# Patient Record
Sex: Female | Born: 1975 | Race: White | Hispanic: No | Marital: Single | Smoking: Former smoker
Health system: Southern US, Academic
[De-identification: ages and names within clinical notes are randomized; demographics above are authoritative.]

## PROBLEM LIST (undated history)

## (undated) DIAGNOSIS — R42 Dizziness and giddiness: Secondary | ICD-10-CM

## (undated) DIAGNOSIS — E7800 Pure hypercholesterolemia, unspecified: Secondary | ICD-10-CM

## (undated) DIAGNOSIS — K219 Gastro-esophageal reflux disease without esophagitis: Secondary | ICD-10-CM

## (undated) DIAGNOSIS — B1920 Unspecified viral hepatitis C without hepatic coma: Secondary | ICD-10-CM

## (undated) DIAGNOSIS — Z973 Presence of spectacles and contact lenses: Secondary | ICD-10-CM

## (undated) HISTORY — PX: HX HIP REPLACEMENT: SHX124

## (undated) HISTORY — PX: HX TONSILLECTOMY: SHX27

## (undated) HISTORY — PX: HX TUBAL LIGATION: SHX77

## (undated) HISTORY — DX: Dizziness and giddiness: R42

## (undated) HISTORY — PX: HX WISDOM TEETH EXTRACTION: SHX21

## (undated) HISTORY — PX: GASTROSCOPY: WVUENDOPRO49

## (undated) HISTORY — PX: HX HERNIA REPAIR: SHX51

## (undated) HISTORY — PX: MEDIAL COLLATERAL LIGAMENT AND LATERAL COLLATERAL LIGAMENT REPAIR, KNEE: SHX2017

## (undated) HISTORY — PX: HX LIVER BIOPSY: 2100001178

## (undated) HISTORY — PX: SPLENECTOMY, TOTAL: SHX788

---

## 2015-01-02 ENCOUNTER — Other Ambulatory Visit (HOSPITAL_COMMUNITY): Payer: Self-pay | Admitting: Emergency Medicine

## 2016-09-08 ENCOUNTER — Encounter (INDEPENDENT_AMBULATORY_CARE_PROVIDER_SITE_OTHER): Payer: Self-pay | Admitting: Gastroenterology

## 2016-09-08 ENCOUNTER — Ambulatory Visit: Payer: No Typology Code available for payment source | Attending: Gastroenterology | Admitting: Gastroenterology

## 2016-09-08 VITALS — BP 136/87 | HR 67 | Temp 97.9°F | Ht 65.59 in | Wt 185.0 lb

## 2016-09-08 DIAGNOSIS — R1110 Vomiting, unspecified: Secondary | ICD-10-CM | POA: Insufficient documentation

## 2016-09-08 DIAGNOSIS — F17200 Nicotine dependence, unspecified, uncomplicated: Secondary | ICD-10-CM | POA: Insufficient documentation

## 2016-09-08 DIAGNOSIS — G43909 Migraine, unspecified, not intractable, without status migrainosus: Secondary | ICD-10-CM | POA: Insufficient documentation

## 2016-09-08 DIAGNOSIS — R1310 Dysphagia, unspecified: Principal | ICD-10-CM

## 2016-09-08 DIAGNOSIS — Z79899 Other long term (current) drug therapy: Secondary | ICD-10-CM | POA: Insufficient documentation

## 2016-09-08 DIAGNOSIS — Z791 Long term (current) use of non-steroidal anti-inflammatories (NSAID): Secondary | ICD-10-CM | POA: Insufficient documentation

## 2016-09-08 DIAGNOSIS — R112 Nausea with vomiting, unspecified: Secondary | ICD-10-CM
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
## 2016-11-04 ENCOUNTER — Encounter (HOSPITAL_COMMUNITY): Payer: Self-pay

## 2016-11-04 ENCOUNTER — Ambulatory Visit
Admission: RE | Admit: 2016-11-04 | Discharge: 2016-11-04 | Disposition: A | Discharge status: Home / Self Care | Payer: No Typology Code available for payment source | Source: Ambulatory Visit

## 2016-11-05 NOTE — Nurses Notes (Signed)
Patient stated that she was diagnosed with bronchitis 10/27/16. Patient currently being treated with antibiotics. Patient stated she is not having fevers, but still has a non productive cough. NP, Juanetta GoslingJoy Williams notified and stated that patient needed to be rescheduled due to bronchitis. Voice message left at GI scheduler office for patient needing to be rescheduled. GI scheduler number also given to patient to reschedule.

## 2016-11-10 ENCOUNTER — Ambulatory Visit (INDEPENDENT_AMBULATORY_CARE_PROVIDER_SITE_OTHER): Payer: Self-pay | Admitting: Family

## 2016-11-25 ENCOUNTER — Encounter (INDEPENDENT_AMBULATORY_CARE_PROVIDER_SITE_OTHER): Payer: Self-pay | Admitting: Gastroenterology

## 2016-11-25 NOTE — Progress Notes (Signed)
Faxed office notes to Dr Terri PiedraMemines as requested by fax. Annie SableVirginia Yeila Morro, RN  11/25/2016, 14:12

## 2016-12-01 ENCOUNTER — Encounter (INDEPENDENT_AMBULATORY_CARE_PROVIDER_SITE_OTHER): Payer: No Typology Code available for payment source | Admitting: Gastroenterology

## 2017-06-10 ENCOUNTER — Encounter (HOSPITAL_COMMUNITY): Admission: RE | Payer: Self-pay | Source: Ambulatory Visit

## 2017-06-10 ENCOUNTER — Ambulatory Visit (HOSPITAL_COMMUNITY)
Admission: RE | Admit: 2017-06-10 | Payer: No Typology Code available for payment source | Source: Ambulatory Visit | Admitting: Gastroenterology

## 2017-06-10 HISTORY — DX: Gastro-esophageal reflux disease without esophagitis: K21.9

## 2017-06-10 HISTORY — DX: Presence of spectacles and contact lenses: Z97.3

## 2017-06-10 HISTORY — DX: Unspecified viral hepatitis C without hepatic coma: B19.20

## 2017-06-10 SURGERY — MANOMETRY ESOPHAGEAL
Site: Mouth

## 2017-06-11 ENCOUNTER — Encounter (HOSPITAL_COMMUNITY): Admission: RE | Payer: Self-pay | Source: Ambulatory Visit

## 2017-06-11 SURGERY — GASTROSCOPY WITH DILATION
Anesthesia: Monitor Anesthesia Care | Site: Mouth

## 2017-06-14 ENCOUNTER — Ambulatory Visit (HOSPITAL_COMMUNITY)
Admission: RE | Admit: 2017-06-14 | Payer: No Typology Code available for payment source | Source: Ambulatory Visit | Admitting: Gastroenterology

## 2017-07-02 ENCOUNTER — Encounter (INDEPENDENT_AMBULATORY_CARE_PROVIDER_SITE_OTHER): Payer: Self-pay | Admitting: Gastroenterology

## 2017-07-02 NOTE — Progress Notes (Signed)
request for an endoscopic procedure for Melissa Hale has not been scheduled. Our office have made several attempts, both by phone and by mail, to schedule this procedure. However, our best efforts have gone without success, and it is important to us to provide service to you and follow-up with your patient.      Melissa CholSwapna Mamye Bolds, MD  07/02/2017, 09:45

## 2022-03-13 ENCOUNTER — Other Ambulatory Visit (HOSPITAL_COMMUNITY): Payer: Self-pay | Admitting: ORTHOPEDIC, SPORTS MEDICINE

## 2022-03-13 DIAGNOSIS — Z96642 Presence of left artificial hip joint: Secondary | ICD-10-CM

## 2022-03-19 ENCOUNTER — Other Ambulatory Visit: Payer: BC Managed Care – PPO | Attending: ORTHOPEDIC, SPORTS MEDICINE

## 2022-03-19 ENCOUNTER — Other Ambulatory Visit: Payer: Self-pay

## 2022-03-19 DIAGNOSIS — Z01818 Encounter for other preprocedural examination: Secondary | ICD-10-CM | POA: Insufficient documentation

## 2022-03-19 LAB — POTASSIUM: POTASSIUM: 4.3 mmol/L (ref 3.5–5.1)

## 2022-03-27 ENCOUNTER — Ambulatory Visit (HOSPITAL_COMMUNITY): Payer: Self-pay

## 2022-03-31 ENCOUNTER — Other Ambulatory Visit: Payer: Self-pay

## 2022-04-01 ENCOUNTER — Ambulatory Visit (HOSPITAL_COMMUNITY)
Admission: RE | Admit: 2022-04-01 | Discharge: 2022-04-01 | Disposition: A | Discharge status: Still In Hospital | Payer: BC Managed Care – PPO | Source: Ambulatory Visit

## 2022-04-01 ENCOUNTER — Other Ambulatory Visit: Payer: Self-pay

## 2022-04-01 DIAGNOSIS — Z96642 Presence of left artificial hip joint: Secondary | ICD-10-CM | POA: Insufficient documentation

## 2022-04-01 NOTE — PT Evaluation (Signed)
[facility] Medicine [facility]  Outpatient Physical Therapy  [address]  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     Medications for this problem: pain medication, anti-inflammatory and M. 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    Next MD visit: 04/14/22    Pain location: LATERAL HIP ; pt has a wound vac that will be removed upon FU with MD                    Pain description: BURNING and CRAMPING    Pain frequency:  INTERMITTENT    Pain rating: Now 0   Best 0   Worst 10     Radiculopathy: NA    Pain increases with: ACTIVITY, WALK and BENDING           decreases with : MEDICATION and REST    Sensation: NO    Weakness: IN LLE    Sleep affected: GOOD WITH MEDICATION    Subjective Functional Reports:    Sitting: 60    Standing: LIMITED  SEVERAL MINUTES  Walking: 10 MINUTE    Lifting: UNABLE                  OBJECTIVE    PROM:  Left hip: 85 - 90 degrees flexion, 20 degrees ER, 0 degrees IR, 0 degrees abd and extension; AROM is pain inhibited.     ROM comments: limited by pain in all  planes      Strength comments: quad and gluteal inhibition noted. Non functional  At present    Gait: USES ASSISTIVE DEVICE, RECIPROCATING STEPS WITH ASYMMETRIC STRIDE LENGTH, INITIATION OF GAIT WITH HESITATION and using FWW.    Palpation: GLOBAL TENDERNESS    Joint mobility:NA    Posture:GENU VALGUS and FEMORAL LATERAL ROTATION        Treatment provided:REVIEW OF POC AND GOALS WITH PATIENT, ALL QUESTIONS ANSWERED, PATIENT EDUCATION and THERAPEUTIC EXERCISE           ASSESSMENT    Impression: the pt has decrease left LE WB and hip ROM, reliance on AD  On POD #2. She has a good gait pattern with FWW. She is highly motivated to regain function in LLE and RTW.     Rehab potential: GOOD    Short Term Goals: 3 Weeks   -IncreaseHip  PROM by 10 degrees (FLEX, EXT, ABD, IR, ER)   -Strength of L LE WFL for supine SLR without extension extension lag.   -LE strength WFL for static stance with symmetric LE wb without pain.  -LE strength WFL for sit to stand with MIN  use of UE to assist  transition.   -Intermittent vs. constant pain. Worst SPS rating less than [10].   -Wean to less restrictive assistive device with minimal to no gait deviation.   -Patient will be independent in HEP    Long Term Goals: 6 - 8 Weeks  -LE strength and ROM WFL to allow for normal body mechanics with mobility.  -LE strength WFL for symmetric LE WB during sit to stand without UE assist.   -Resume community ambulation without an assistive device.   -Sleep not disrupted by LE pain. Worst SPS rating less than 3   -LE strength / ROM WFL for patient to negotiate steps reciprocally with  rail.   -sustained wb tolerance wfl for rtw      PLAN  Patient will attend 2 times per week x 6 weeks. Therapy may include, but is not limited to THERAPEUTIC EXERCISES, MYOFASCIAL/JOINT MOBILIZATION, POSTURE/BODY MECHANICS, TRANSFER/GAIT TRAINING and HOME INSTRUCTIONS    Plan for next visit nwb ROM and low level strengthening , close chain ROM       Evaluation  complexity:   Personal factors impacting POC: PRE-EXISTING FUNCTIONAL LIMITATIONS and OCCUPATIONAL ADLS (IE HEAVY LIFTING, REPETITIVE TASKS, LONG HOURS)   Co-morbidities impacting POC: none  Complexity of physical exam: INCLUDING MUSCULOSKELETAL SYSTEM (POSTURE, ROM, STRENGTH, HEIGHT/WEIGHT), INCLUDING NEUROMUSCULAR EXAM (BALANCE, GAIT, LOCOMOTION, MOBILITY) and INCLUDING ACTIVITY/MOBILITY RESTRICTIONS   Clinical Presentation: STABLE   Evaluation Complexity: MODERATE-HISTORY 1-2, EXAMINATION 3+, PRESENTATION  EVOLVING/CHANGING      Total Session Time 45         Intervention minutes: EVALUATION 25 minutes and THERAPEUTIC EXERCISE 20 minutes    Lunette Stands, PT  04/01/2022, 11:21          Start of Service: _________          Certification:    From:______  Through:_________    I certify the need for these services furnished under this plan of treatment and while under my care.    Referring Provider Signature: _______________     Date : _____________________

## 2022-04-07 ENCOUNTER — Ambulatory Visit (HOSPITAL_COMMUNITY)
Admission: RE | Admit: 2022-04-07 | Discharge: 2022-04-07 | Disposition: A | Discharge status: Still In Hospital | Payer: BC Managed Care – PPO | Source: Ambulatory Visit

## 2022-04-07 ENCOUNTER — Other Ambulatory Visit: Payer: Self-pay

## 2022-04-07 NOTE — PT Treatment (Signed)
[facility]  Outpatient Physical Therapy  [address]  [place], [address]  [phone]  (Fax) [phone]    Physical Therapy Treatment Note    Date: 04/07/2022  Patient's Name: Melissa Hale  Date of Birth: [birth year]            Visit #/POC:2/12; 5/2  Authorization:      Evaluating Physical Therapist: Lunette Stands, PT  PT diagnosis/Reason for Referral: s/p L THA  Next Scheduled Physician Appointment: 04/14/22          Subjective: Patient reports her Melissa Hale is getting stronger and she is able to perform active SLR. States she cooked a full meal yesterday. She is out of pain meds, upon arrival pain is 6/10 with movement, 3/10 static sitting.  States she is trying to observe her hip precautions. To RTW she needs to be able to stand 2 hrs consecutively, bend, push, pull. Patient states MD told her she could use heel lift in L shoe to complete leg length.     Objective: treatment delivered as noted below.      EXERCISE/ACTIVITY NAME REPETITIONS RESISTANCE COMPLETED THIS DOS   Supine L hip flexor stretch with towel support under distal HS   3 min  yes   MFR L hip flexor and superior Quads during static supine stretch    Roller ball yes   SLR   X3 with Pain  yes   hooklying march   2x10  yes   bridges   2x10  yes   Brief trigger point release: L groin    manual yes   Heel lift placement    1 ply L shoe yes                         Assessment: when supine and LLE extended she has pain in hip flexors and proximal Quads secondary to soft tissue shortening in response to LLD that was present prior to THA. Pain controls session limiting reps/sets with exercises.  Attempted sidelying clamshell, but unable secondary to pain and weakness. Patient still has some length discrepancy concerning LLE, per patient stating MD has approved insert to correct a heel lift using 1 ply for trial was initiated this visit. She is very tender in L groin, but after releases has increased range with  hooklying fall out of L.     Plan: Will initiate restorative yoga for L hip ROM and work with more trigger point release to L groin. Assess response to heel lift.     Total Session Time 37, Timed code minutes 37 and Untimed code minutes 0  THERAPEUTIC EXERCISE 37 minutes      Tarri Abernethy, PTA  04/07/2022, 09:01

## 2022-04-10 ENCOUNTER — Ambulatory Visit (HOSPITAL_COMMUNITY)
Admission: RE | Admit: 2022-04-10 | Discharge: 2022-04-10 | Disposition: A | Discharge status: Still In Hospital | Payer: BC Managed Care – PPO | Source: Ambulatory Visit

## 2022-04-10 ENCOUNTER — Other Ambulatory Visit: Payer: Self-pay

## 2022-04-10 NOTE — PT Treatment (Signed)
[facility] Medicine [facility]  Outpatient Physical Therapy  [address]  [place], [address]  [phone]  (Fax) [phone]    Physical Therapy Treatment Note    Date: 04/10/2022  Patient's Name: Melissa Hale  Date of Birth: 01-Jul-1976            Visit #/POC:3/12; 5/2  Authorization:      Evaluating Physical Therapist: Lunette Stands, PT  PT diagnosis/Reason for Referral: s/p L THA  Next Scheduled Physician Appointment: 04/14/22          Subjective: Patient reports L groin was very sore day of last visit, but the next day the LLE felt better in general. She states she tried amb with single axillary crutch at home and feels very comfortable with this AD. She asked if she could transition from RW to single axillary crutch. She notes heel insert has helped equal her gait when she has shoes don.     Objective: treatment delivered as noted below.      EXERCISE/ACTIVITY NAME REPETITIONS RESISTANCE COMPLETED THIS DOS   Supine L hip flexor stretch with towel support under distal HS   3 min  yes   MFR L hip flexor and superior Quads during static supine stretch    Roller ball yes   SLR   X10 without pain until rep 9&10  Yes: HEP 04/07/22   hooklying march   x20  Yes: HEP 04/07/22   Adella Nissen with hip abd   2x10    x10     Yellow Tband Yes: HEP 04/07/22  Yes: HEP 04/10/22   Brief trigger point release: L groin    manual yes   Heel lift placement    1 ply L shoe no   Restorative yoga: bilat knee fall out   X2 with 5 min hold ea  Yes: HEP 04/10/22   hooklying abd: bilat L/R   X10 ea Yellow Tband Yes: HEP 04/10/22   Step ups: lateral  --fwd x10  x10 4" step  6" step Yes  yes   HEP review and education   yes   Standing heel raises x20  yes         Assessment: During supine LLE extended for hip flexor stretch she did not require towel support under thigh secondary to pain. She notes still some discomfort, but much improved. Was able to increase reps with SLR from x3 to x10 and not experiencing pain  until the last two reps as noted above.  Attempted clamshell exercise, but unable to perform secondary to weakness. Progressed HEP with Tband for hooklying abd and then added restorative yoga to HEP for L groin/adductor stretch. See above for complete HEP program currently.  Overall, excellent improvements since last visit with L groin/adductor/hip flexor muscle lengthening and strength in the LLE.    Short Term Goals: 3 Weeks               -IncreaseHip PROM by 10 degrees (FLEX, EXT, ABD, IR, ER)               -Strength of L LE WFL for supine SLR without extension extension lag.               -LE strength WFL for static stance with symmetric LE wb without pain.  -LE strength WFL for sit to stand with MIN  use of UE to assist  transition.               -  Intermittent vs. constant pain. Worst SPS rating less than [10].               -Wean to less restrictive assistive device with minimal to no gait deviation.               -Patient will be independent in HEP    Long Term Goals: 6 - 8 Weeks  -LE strength and ROM WFL to allow for normal body mechanics with mobility.  -LE strength WFL for symmetric LE WB during sit to stand without UE assist.               -Resume community ambulation without an assistive device.               -Sleep not disrupted by LE pain. Worst SPS rating less than 3   -LE strength / ROM WFL for patient to negotiate steps reciprocally with  rail.               -sustained wb tolerance wfl for rtw      Plan:  Assess response to treatment. Will add Tband to hooklying march next visit.    Total Session Time 40 , Timed code 40 minutes  and Untimed code minutes 0  THERAPEUTIC EXERCISE 40 minutes      Tarri Abernethy, PTA  04/10/2022, 08:49

## 2022-04-13 ENCOUNTER — Inpatient Hospital Stay
Admission: RE | Admit: 2022-04-13 | Discharge: 2022-04-13 | Disposition: A | Discharge status: Home / Self Care | Payer: BC Managed Care – PPO | Source: Ambulatory Visit | Attending: ORTHOPEDIC, SPORTS MEDICINE | Admitting: ORTHOPEDIC, SPORTS MEDICINE

## 2022-04-13 ENCOUNTER — Ambulatory Visit (HOSPITAL_COMMUNITY)
Admission: RE | Admit: 2022-04-13 | Discharge: 2022-04-13 | Disposition: A | Discharge status: Still In Hospital | Payer: BC Managed Care – PPO | Source: Ambulatory Visit

## 2022-04-13 ENCOUNTER — Other Ambulatory Visit (HOSPITAL_COMMUNITY): Payer: Self-pay | Admitting: ORTHOPEDIC, SPORTS MEDICINE

## 2022-04-13 ENCOUNTER — Other Ambulatory Visit: Payer: Self-pay

## 2022-04-13 DIAGNOSIS — M1612 Unilateral primary osteoarthritis, left hip: Secondary | ICD-10-CM | POA: Insufficient documentation
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
## 2022-04-17 ENCOUNTER — Ambulatory Visit (HOSPITAL_COMMUNITY)
Admission: RE | Admit: 2022-04-17 | Discharge: 2022-04-17 | Disposition: A | Discharge status: Still In Hospital | Payer: BC Managed Care – PPO | Source: Ambulatory Visit

## 2022-04-17 ENCOUNTER — Other Ambulatory Visit: Payer: Self-pay

## 2022-04-21 ENCOUNTER — Ambulatory Visit (HOSPITAL_COMMUNITY)
Admission: RE | Admit: 2022-04-21 | Discharge: 2022-04-21 | Disposition: A | Discharge status: Still In Hospital | Payer: BC Managed Care – PPO | Source: Ambulatory Visit

## 2022-04-21 ENCOUNTER — Other Ambulatory Visit: Payer: Self-pay

## 2022-04-21 NOTE — PT Treatment (Signed)
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 Continues to amb using single loft strand crutch.     Objective: Treatment delivered as noted below.       EXERCISE/ACTIVITY NAME REPETITIONS RESISTANCE COMPLETED THIS DOS   Supine L hip flexor stretch with towel support under distal HS   3 min  No: D/C    MFR L hip flexor and superior Quads     Roller ball no   SLR   2X10 without pain until rep 9&10  no: HEP 04/07/22   hooklying march   x10 Red Tband no: HEP 04/07/22   Forrest Moron with hip abd   2x10      2x10       Red Tband no: D/C to performed with hip abd  no: HEP 04/10/22   Brief trigger point release: L groin    manual no   Heel lift placement    1 ply L shoe no   Restorative yoga: bilat knee fall out   X2 with 5 min hold ea  no: D/C to HEP only 04/13/22   hooklying abd: bilat L/R   X10 ea Red Tband no: HEP 04/10/22   Step ups: lateral  --fwd x10  x10 6" step  6" step Yes  yes   HEP review and education   yes   Standing heel raises x20  no   nustep BLE only 7 min Level 4 yes   biodex balance platform % wb  -random control  --wt shift: horiz, vertical, diagonal x2  --limits of stability X1@ 1 min  X1 min  X1 min ea  X1  Static platform  L8  L8  L8    Yes  Yes  Yes  yes   biodex balance platform maze control x4 L2, 4, 6, 8 yes   Sit to stand to sit TT  Elevated seat height x10 no         // bars:   --Alternating toe taps   --mini squats (observing hip precautions)  --resisted  stepping: fwd/retro with diagonal and sidestepping    2x10  x10    x5 ea   minA of UE  ModA of UE    Red Tband   no  no    n0   Bridges: normal  --SL with PB x10  x10  Yes  yes   Supine L hip flexor stretch off mat table X2 with 1 min hold  Yes: HEP 04/21/22         Assessment:  She reports Wt shift multi-directions was most challenging of all Biodex exercises. She reports increased pain during alternating toe taps with engaging L glut and L groin being extremely TTT. Her hip flexor tightness has greatly improved to now requiring off mat table to obtain stretch, this stretching exercise was added to her HEP. Gait deviation remains present that  is made worse when she is amb without AD.       Short Term Goals:3Weeks  -IncreaseHip PROM by 10degrees (FLEX, EXT, ABD, IR, ER) No assessed  -Strength of LLE WFL for supine SLR without extension extension lag. (MET 04/13/22)  -LE strength WFL for static stance with symmetric LE wb without pain/. (MET 04/13/22)  -LE strength WFL for sit to stand with MIN use of UE to assist transition. (MET 04/13/22)  -Intermittent vs. constant pain. Worst SPS rating less than [10]. (MET 04/13/22)  -Wean to less restrictiveassistive device with minimal to no gait deviation. Progressing, but not met  -Patient will be independent in HEP (MET 04/10/22)    Long Term Goals:6 - 8Weeks  -LE strength and ROM WFL to allow for normal body mechanics with mobility.  -LE strength WFL for symmetric LE WB during sit to stand without UE assist.  -Resume community ambulation without an assistive device.  -Sleep not disrupted by LE pain. Worst SPS rating less than 3  -LE strength / ROM WFL for patient to negotiate steps reciprocally with rail.  -sustained wb tolerance wfl for rtw  Plan: Assess response to treatment. Will inquire what her work duties are to incorporate "work  functional" exercises.     Total Session Time 44 and Timed code 44 minutes   THERAPEUTIC EXERCISE 44 minutes      Benjie Karvonen, PTA  04/22/2022, 13:58

## 2022-04-24 ENCOUNTER — Other Ambulatory Visit: Payer: Self-pay

## 2022-04-24 ENCOUNTER — Ambulatory Visit (HOSPITAL_COMMUNITY)
Admission: RE | Admit: 2022-04-24 | Discharge: 2022-04-24 | Disposition: A | Discharge status: Still In Hospital | Payer: BC Managed Care – PPO | Source: Ambulatory Visit

## 2022-04-24 NOTE — PT Treatment (Signed)
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 She states she has been working a lot with stair negotiation facilitating muscle strengthening by ascending with involved and descending with uninvolved. She reports with supine hip flexor stretching she has noticed decreased tightness in flexors/groin. She states sleep is no longer disrupted secondary to L hip pain, pain at worst recently 4-5/10.     Objective: Treatment delivered as noted below. Initiated higher level CKC strengthening to further facilitate functional gains.       EXERCISE/ACTIVITY NAME REPETITIONS RESISTANCE COMPLETED THIS DOS   Supine L hip flexor stretch with towel support under distal HS   3 min  No: D/C    MFR L hip flexor and superior Quads     Roller ball no   SLR   2X10 without pain until rep 9&10  no: HEP 04/07/22   hooklying march   x10 Red Tband no: HEP 04/07/22   Forrest Moron with hip abd   2x10      2x10       Red Tband no: D/C to performed with hip abd  no: HEP 04/10/22   Brief trigger point release: L groin    manual no   Heel lift placement    1 ply L shoe no   Restorative yoga: bilat knee fall out   X2 with 5 min hold ea  no: D/C to HEP only 04/13/22   hooklying abd: bilat L/R   X10 ea Red Tband no: HEP 04/10/22   Step ups: lateral  --fwd x20  x10 6" step  6" step Yes  yes   HEP review and education   no   Standing  heel raises x20  no   nustep BLE only 10 min Level 5 yes   biodex balance platform % wb  -random control  --wt shift: horiz, vertical, diagonal x2  --limits of stability X1@ 1 min  X1 min  X1 min ea  X1  Static platform  L8  L8  L8    no  no  no  no   biodex balance platform maze control x4 L2, 4, 6, 8 no   Sit to stand to sit TT  Elevated seat height x10 no         // bars:   --Alternating toe taps   --mini squats (observing hip precautions)  --resisted stepping: fwd/retro with --diagonal and sidestepping   --resisted stepping: f/b, sidestepping   2x10  x10    x5   x5  x5 ea   minA of UE  ModA of UE    Red Tband  Red Tband  tubing   no  no    No  No  yes   Bridges: normal  --SL with PB x10  x10  Yes  yes  Supine L hip flexor stretch off mat table X2 with 1 min hold  no: HEP 04/21/22   Ball toss: firm surface and Airexyes   yes   Tandem balance on Airex X2 ea @ 30 sec hold 1 finger hold yes   Multi-direcitonal perturbation standing on Airex With tubing  yes               Assessment:  Excellent progress with decreased gait deviation between visits this week and greatly in response to her HEP compliance. Moderate sway with tandem balance on Airex without single finger touch for proprioception. She does fatigue with progressive strengthening and states increased stiffness in L hip, however overall, good tolerance to progressive strengthening. She is now able to perform stair negotiation using single HR which is functional gains to enter/exit home, she states multiple entries into the home ranging from 4-18 steps.      Short Term Goals:3Weeks  -IncreaseHip PROM by 10degrees (FLEX, EXT, ABD, IR, ER) No assessed  -Strength of LLE WFL for supine SLR without extension extension lag. (MET 04/13/22)  -LE strength WFL for static stance with symmetric LE wb without pain/. (MET 04/13/22)  -LE strength WFL for sit to stand with MIN use of UE to assist transition. (MET  04/13/22)  -Intermittent vs. constant pain. Worst SPS rating less than [10]. (MET 04/13/22)  -Wean to less restrictiveassistive device with minimal to no gait deviation. Progressing, but not met  -Patient will be independent in HEP (MET 04/10/22)    Long Term Goals:6 - 8Weeks  -LE strength and ROM WFL to allow for normal body mechanics with mobility. (PROGRESSING)  -LE strength WFL for symmetric LE WB during sit to stand without UE assist.  -Resume community ambulation without an assistive device.  -Sleep not disrupted by LE pain. Worst SPS rating less than 3(partially MET)  -LE strength / ROM WFL for patient to negotiate steps reciprocally with rail. (MET 04/24/22)  -sustained wb tolerance wfl for rtw  Plan: Assess response to treatment this day and status of her gait deviation.       Total Session Time 45 and Timed code 45 minutes   THERAPEUTIC EXERCISE 45 minutes      Benjie Karvonen, PTA  04/24/2022, 08:50

## 2022-04-28 ENCOUNTER — Other Ambulatory Visit: Payer: Self-pay

## 2022-04-28 ENCOUNTER — Ambulatory Visit (HOSPITAL_COMMUNITY)
Admission: RE | Admit: 2022-04-28 | Discharge: 2022-04-28 | Disposition: A | Discharge status: Still In Hospital | Payer: BC Managed Care – PPO | Source: Ambulatory Visit

## 2022-04-28 NOTE — PT Treatment (Signed)
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 She HAS INCISIONAL SORENESS WITH PALPATION. She HAS CC OF STIFFNESS AND SORENESS THAT IS VARIABLE. SPS TODAY = 0. She IS TAKING SOME SHORT STEPS WITHOUT CRUTCH. She TAKES STEPS RECIPROCALLY WITH SINGLE RAIL UP/DOWN. She REPORTS RESTORATIVE SLEEP. She TOLERATES LEFT SIDE LYING. She CAN SIT AS She WANTS TO. She CAN STAND LONG ENOUGH TO COOK A MEAL. HER WORST SPS RATING IN PAST WEEK  = 5 , AT END OF DAY AFTER AN ACTIVE DAY.    Objective:     SUPINE HIP PROM  EXERCISE/ACTIVITY NAME REPETITIONS RESISTANCE COMPLETED THIS DOS   SIT TO STAND TO SIT WITH = WB AND NO UE PUSH OFF    X 4  NA, MOD EFFORT YES, REVIEWED AS PART OF HEP   SUPINE BRIDGING WITH PB BILAT LE ON BALL   X10 , 4 SEC COUNT NA YES, REVIEWED AS PART OF HEP   SUPINE BRIDGE ONE LE ON BALL, ONE ON FLOOR   L/R ALTERNATING   X 5  NA YES, REVIEWED AS PART    SUPINE LEG CURL WITH PB, FOCUS ON CORE STABILITY   X10 NA YES, REVIEWED AS PART OF HEP   SUPINE HIP ABD WITH NEUTRAL HIP AND WITH HIP ER ; ISOTONICS AND ISOMETRICS    YES  BLACK THERABAND  YES , REVIEWED AS PART OF HEP,   cybex bilat le press   2x 10 70 lbs yes   cybex single le press   2x 10  60 lbs yes   REASSESSMENT   NA NA YES             EXERCISE/ACTIVITY NAME REPETITIONS RESISTANCE COMPLETED THIS DOS   Supine L hip flexor stretch with towel support under distal HS   3 min  No: D/C    MFR L hip flexor and superior Quads     Roller ball no   SLR   2X10 without pain until rep 9&10  no: HEP 04/07/22   hooklying march    x10 Red Tband no: HEP 04/07/22   Forrest Moron with hip abd   2x10      2x10       Red Tband no: D/C to performed with hip abd  no: HEP 04/10/22   Brief trigger point release: L groin    manual no   Heel lift placement    1 ply L shoe no   Restorative yoga: bilat knee fall out   X2 with 5 min hold ea  no: D/C to HEP only 04/13/22   hooklying abd: bilat L/R   X10 ea Red Tband no: HEP 04/10/22   Step ups: lateral  --fwd x10  x10 4" step  6" step NO  yes   HEP review and education   no   Standing heel raises x20  no   nustep BLE only 8 min Level 2  no   biodex balance platform %  wb Static platform, 40 sec 3 reps  no   biodex balance platform maze control Easy level, mobile plaform 3 reps no   Sit to dtand to sit TT  From 90/90 sitting x10 yes         // bars:   --Alternating toe taps   --mini squats (observing hip precautions)  --resisted stepping: fwd/retro with diagonal and sidestepping    2x10  x10    x5 ea   minA of UE  ModA of UE    Red Tband   no  no    n0     Assessment: PT IS ABLE TO STAND WITH =  WT. DISTRIBUTION WITHOUT DISCOMFORT; She PERCEIVES PRESSURE WHEN SHIFTING HER COG TO LEFT OF MIDLINE. She CAN SQUAT TO 70 DEGREES OF HIP / KNEE FLEXION. She PERFORMS A SUPINE SLR WITH NO EXTENSION LAG AND NORMAL EFFORT. She HAS A FLUID AND SYMMETRIC GAIT WITH A SINGLE LOFSTRAND CRUTCH. She IS UNABLE TO DO LLE SLS . She PERFORMS RLE SLS BALANCE WITH COMPENSATORY MECHANICS.     Short Term Goals:3Weeks  -IncreaseHip PROM by 10degrees (FLEX, EXT, ABD, IR, ER) No assessed  -Strength of LLE WFL for supine SLR without extension extension lag. (MET 04/13/22)  -LE strength WFL for static stance with symmetric LE wb without pain/. (MET 04/13/22)  -LE strength WFL for sit to stand with MIN use of UE to assist transition. (MET 04/13/22)  -Intermittent vs. constant pain. Worst SPS rating less than [10]. (MET 04/13/22)  -Wean to less  restrictiveassistive device with minimal to no gait deviation.  ( MET 04/28/22)  -Patient will be independent in HEP (MET 04/10/22)    Long Term Goals:6 - 8Weeks  -LE strength and ROM WFL to allow for normal body mechanics with mobility.  -LE strength WFL for symmetric LE WB during sit to stand without UE assist.  -Resume community ambulation without an assistive device.  -Sleep not disrupted by LE pain. Worst SPS rating less than 3 ( MET IN PART 04/28/22)  -LE strength / ROM WFL for patient to negotiate steps reciprocally with rail.  -sustained wb tolerance wfl for rtw  Plan: [place] AD AS ABLE.    Total Session Time 45 and Timed code minutes 45  THERAPEUTIC EXERCISE 45 minutes      Leanor Rubenstein, PT  04/28/2022, 09:47

## 2022-04-30 ENCOUNTER — Ambulatory Visit
Admission: RE | Admit: 2022-04-30 | Discharge: 2022-04-30 | Disposition: A | Discharge status: Still In Hospital | Payer: BC Managed Care – PPO | Source: Ambulatory Visit | Attending: ORTHOPEDIC, SPORTS MEDICINE | Admitting: ORTHOPEDIC, SPORTS MEDICINE

## 2022-04-30 ENCOUNTER — Other Ambulatory Visit: Payer: Self-pay

## 2022-04-30 NOTE — PT Treatment (Signed)
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 States muscle soreness woke her up during sleep. She has been using Vulturine cream and although she still has lingering soreness it is better.   Objective:     SUPINE HIP PROM  EXERCISE/ACTIVITY NAME REPETITIONS RESISTANCE COMPLETED THIS DOS   SIT TO STAND TO SIT WITH = WB AND NO UE PUSH OFF    X 4  NA, MOD EFFORT no, REVIEWED AS PART OF HEP   SUPINE BRIDGING WITH PB BILAT LE ON BALL   X10 , 4 SEC COUNT NA no REVIEWED AS PART OF HEP   SUPINE BRIDGE ONE LE ON BALL, ONE ON FLOOR   L/R ALTERNATING   X 5  NA no, REVIEWED AS PART    SUPINE LEG CURL WITH PB, FOCUS ON CORE STABILITY   X10 NA no, REVIEWED AS PART OF HEP   SUPINE HIP ABD WITH NEUTRAL HIP AND WITH HIP ER ; ISOTONICS AND ISOMETRICS    YES  [place] THERABAND  no , REVIEWED AS PART OF HEP,   cybex bilat le press   2x 10 70 lbs yes   cybex single le press   2x 10  40 lbs yes   REASSESSMENT   NA NA no   Hip abd machine   2x10 20# yes     EXERCISE/ACTIVITY NAME REPETITIONS RESISTANCE COMPLETED THIS DOS   Supine L hip flexor stretch with towel support under distal HS   3 min  No: D/C    MFR L hip flexor and superior Quads     Roller ball no   SLR   2X10 without pain until rep 9&10  no: HEP 04/07/22   hooklying march   x10 Red Tband no: HEP 04/07/22   Forrest Moron with hip abd   2x10      2x10       Red Tband no: D/C to performed with hip abd  no: HEP 04/10/22   Brief trigger point release: L groin    manual no   Heel  lift placement    1 ply L shoe no   Restorative yoga: bilat knee fall out   X2 with 5 min hold ea  no: D/C to HEP only 04/13/22   hooklying abd: bilat L/R   X10 ea Red Tband no: HEP 04/10/22   Step ups: lateral  --fwd x10  x10 4" step  6" step NO  no   HEP review and education   no   Standing heel raises x20  no   nustep BLE only 8 min Level 4 yes   biodex balance platform % wb Static platform, 40 sec 3 reps  no   biodex balance platform maze control Easy level, mobile plaform 3 reps no   Sit to dtand to sit TT  From 90/90 sitting x10 no         // bars:   --  Alternating toe taps   --mini squats (observing hip precautions)  --resisted stepping: fwd/retro with diagonal and sidestepping    2x10  x10    x5 ea   minA of UE  ModA of UE    green Tband   no  no    yes     Assessment: Only performed Leg press from new exercises initiated last visit secondary to lingering muscle soreness, held squats this visit. Reduced weight with SL press secondary to L glut soreness.     Short Term Goals:3Weeks  -IncreaseHip PROM by 10degrees (FLEX, EXT, ABD, IR, ER) No assessed  -Strength of LLE WFL for supine SLR without extension extension lag. (MET 04/13/22)  -LE strength WFL for static stance with symmetric LE wb without pain/. (MET 04/13/22)  -LE strength WFL for sit to stand with MIN use of UE to assist transition. (MET 04/13/22)  -Intermittent vs. constant pain. Worst SPS rating less than [10]. (MET 04/13/22)  -Wean to less restrictiveassistive device with minimal to no gait deviation.  ( MET 04/28/22)  -Patient will be independent in HEP (MET 04/10/22)    Long Term Goals:6 - 8Weeks  -LE strength and ROM WFL to allow for normal body mechanics with mobility.  -LE strength WFL for symmetric LE WB during sit to stand without UE assist.  -Resume community ambulation without an assistive device.  -Sleep not disrupted  by LE pain. Worst SPS rating less than 3 ( MET IN PART 04/28/22)  -LE strength / ROM WFL for patient to negotiate steps reciprocally with rail.  -sustained wb tolerance wfl for rtw    Plan: Will resume progressive exercises next visit that were initiated last visit.     Total Session Time 40 and Timed code 35 minutes   THERAPEUTIC EXERCISE 35 minutes      Benjie Karvonen, PTA  04/30/2022, 10:16

## 2022-05-05 ENCOUNTER — Ambulatory Visit
Admission: RE | Admit: 2022-05-05 | Discharge: 2022-05-05 | Disposition: A | Discharge status: Still In Hospital | Payer: BC Managed Care – PPO | Source: Ambulatory Visit | Attending: ORTHOPEDIC, SPORTS MEDICINE | Admitting: ORTHOPEDIC, SPORTS MEDICINE

## 2022-05-05 ENCOUNTER — Other Ambulatory Visit: Payer: Self-pay

## 2022-05-05 DIAGNOSIS — Z96642 Presence of left artificial hip joint: Secondary | ICD-10-CM | POA: Insufficient documentation

## 2022-05-05 NOTE — PT Treatment (Addendum)
[facility]  Outpatient Physical Therapy  [place], [address]  [phone]  [phone]    Physical Therapy Treatment Note    Date: 05/05/2022  Patient's Name: Melissa Hale  Date of Birth: 10/02/1976      ADDENDUM 05/13/22: THIS PT CANCELLED ALL REMAINING SESSIONS AS She FELT READY FOR DISCHARGE. She ATTENDED 10 53F 12 SESSIONS PER POC. She IS SCHEDULED TO FU WITH MD ON 04/28/22. SEE THE BELOW NOTE FOR HER STATUS UPON HER LAST SESSION. THE PT IS DISCHARGED PER HER DECISION. 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 She arrives carrying her loft strand crutch with minimal Trendelenburg gait being present. States she has been performing stair negotiation reciprocally using single handrail.     Objective:     SUPINE HIP PROM  EXERCISE/ACTIVITY NAME REPETITIONS RESISTANCE COMPLETED THIS DOS   SIT TO STAND TO SIT WITH = WB AND NO UE PUSH OFF    X 4  NA, MOD EFFORT no, REVIEWED AS PART OF HEP   SUPINE BRIDGING WITH PB BILAT LE ON BALL   X10 , 4 SEC COUNT NA no REVIEWED AS PART OF HEP   SUPINE BRIDGE ONE LE ON BALL, ONE ON FLOOR   L/R ALTERNATING   X 5  NA no, REVIEWED AS PART    SUPINE LEG CURL WITH PB, FOCUS ON CORE STABILITY   X10 NA no, REVIEWED AS PART OF HEP   SUPINE HIP ABD WITH NEUTRAL HIP AND WITH HIP ER ; ISOTONICS AND ISOMETRICS    YES  BLACK THERABAND  no , REVIEWED AS PART OF HEP,   cybex bilat le press   x25 80 lbs yes   cybex single le press   x15 60 lbs yes   REASSESSMENT   NA NA no   Hip abd machine   x10  x25 30#  x35# Yes  yes     EXERCISE/ACTIVITY NAME REPETITIONS RESISTANCE COMPLETED THIS DOS   Supine L hip flexor stretch with towel support under distal HS   3 min  No: D/C    MFR L hip flexor and superior Quads     Roller  ball no   SLR   2X10 without pain until rep 9&10  no: HEP 04/07/22   hooklying march   x10 Red Tband no: HEP 04/07/22   Bridges  L SL bridge      [place] with hip abd   2x10  2x10      2x10 with DF  R ankle over L knee      Red Tband Yes  Yes      no   Brief trigger point release: L groin    manual no   Heel lift placement    1 ply L shoe no   Restorative yoga: bilat knee fall out   X2 with 5 min hold ea  no: D/C to HEP only 04/13/22   hooklying abd: bilat L/R   X10 ea Red Tband no: HEP 04/10/22   Step ups: lateral  --fwd x10  x10 4" step  6" step NO  no   HEP review and education   no   Standing heel raises x20  no  nustep BLE only 8  min Level 6 yes   biodex balance platform % wb Static platform, 40 sec 3 reps  no   biodex balance platform maze control Easy level, mobile plaform 3 reps no   Sit to dtand to sit TT  From 90/90 sitting x10 no         // bars:   --Alternating toe taps   --mini squats (observing hip precautions)  --resisted stepping: fwd/retro with diagonal and sidestepping    2x10  x10    x5 ea   minA of UE  ModA of UE    green Tband   no  no    yes   Fire hydrant bilat  x10 ea  Yes     Quadruped with bilat hip abd with toe tap x10 ea  yes   Modified slide plank (L) x10   yes   HEP education   yes     Assessment: BUE weakness making her unable to assume plank position. Progressed her HEP to include today's exercises that focused on glut strengthening to resolve gait deviation, she has very slight Trendelenburg gait present without AD. See below for progress towards goals.     Short Term Goals:3Weeks  -IncreaseHip PROM by 10degrees (FLEX, EXT, ABD, IR, ER) Not assessed  -Strength of LLE WFL for supine SLR without extension extension lag. (MET 04/13/22)  -LE strength WFL for static stance with symmetric LE wb without pain/. (MET 04/13/22)  -LE strength WFL for sit to stand with MIN use of UE to assist transition. (MET  04/13/22)  -Intermittent vs. constant pain. Worst SPS rating less than [10]. (MET 04/13/22)  -Wean to less restrictiveassistive device with minimal to no gait deviation.  ( MET 04/28/22)  -Patient will be independent in HEP (MET 04/10/22)    Long Term Goals:6 - 8Weeks  -LE strength and ROM WFL to allow for normal body mechanics with mobility. Progressing  -LE strength WFL for symmetric LE WB during sit to stand without UE assist. MET 05/05/22  -Resume community ambulation without an assistive device. Progressing  -Sleep not disrupted by LE pain. Worst SPS rating less than 3 ( MET IN PART 04/28/22)  -LE strength / ROM WFL for patient to negotiate steps reciprocally with rail. MET 05/05/22  -sustained wb tolerance wfl for rtw Progressing    Plan: Will resume progressive exercises next visit that were initiated last visit. Will assess PROM of L hip next visit.     Total Session Time 40 and Timed code 40 minutes   THERAPEUTIC EXERCISE 40 minutes    Benjie Karvonen, PTA

## 2022-05-07 ENCOUNTER — Ambulatory Visit (HOSPITAL_COMMUNITY): Payer: Self-pay

## 2022-05-12 ENCOUNTER — Ambulatory Visit (HOSPITAL_COMMUNITY): Payer: Self-pay

## 2022-05-14 ENCOUNTER — Ambulatory Visit (HOSPITAL_COMMUNITY): Payer: Self-pay

## 2022-05-19 ENCOUNTER — Ambulatory Visit (HOSPITAL_COMMUNITY): Payer: Self-pay

## 2022-05-21 ENCOUNTER — Ambulatory Visit (HOSPITAL_COMMUNITY): Payer: Self-pay

## 2022-07-01 ENCOUNTER — Other Ambulatory Visit: Payer: Self-pay

## 2022-08-04 ENCOUNTER — Emergency Department (HOSPITAL_BASED_OUTPATIENT_CLINIC_OR_DEPARTMENT_OTHER): Payer: Worker's Comp, Other unspecified

## 2022-08-04 ENCOUNTER — Encounter (HOSPITAL_BASED_OUTPATIENT_CLINIC_OR_DEPARTMENT_OTHER): Payer: Self-pay

## 2022-08-04 ENCOUNTER — Emergency Department
Admission: EM | Admit: 2022-08-04 | Discharge: 2022-08-04 | Disposition: A | Discharge status: Home / Self Care | Payer: Worker's Comp, Other unspecified | Attending: Family | Admitting: Family

## 2022-08-04 ENCOUNTER — Other Ambulatory Visit: Payer: Self-pay

## 2022-08-04 DIAGNOSIS — S8002XA Contusion of left knee, initial encounter: Secondary | ICD-10-CM | POA: Insufficient documentation

## 2022-08-04 DIAGNOSIS — S63501A Unspecified sprain of right wrist, initial encounter: Secondary | ICD-10-CM | POA: Insufficient documentation

## 2022-08-04 DIAGNOSIS — Z23 Encounter for immunization: Secondary | ICD-10-CM | POA: Insufficient documentation

## 2022-08-04 DIAGNOSIS — W19XXXA Unspecified fall, initial encounter: Secondary | ICD-10-CM | POA: Insufficient documentation

## 2022-08-04 DIAGNOSIS — Y990 Civilian activity done for income or pay: Secondary | ICD-10-CM | POA: Insufficient documentation

## 2022-08-04 DIAGNOSIS — S80212A Abrasion, left knee, initial encounter: Secondary | ICD-10-CM | POA: Insufficient documentation

## 2022-08-04 MED ORDER — KETOROLAC 60 MG/2 ML INTRAMUSCULAR SOLUTION
60.0000 mg | INTRAMUSCULAR | Status: AC
Start: 2022-08-05 — End: 2022-08-04
  Administered 2022-08-04: 60 mg via INTRAMUSCULAR

## 2022-08-04 MED ORDER — KETOROLAC 60 MG/2 ML INTRAMUSCULAR SOLUTION
INTRAMUSCULAR | Status: AC
Start: 2022-08-04 — End: 2022-08-04
  Filled 2022-08-04: qty 2

## 2022-08-04 MED ORDER — DIPHTH,PERTUSSIS(ACEL),TETANUS 2.5 LF UNIT-8 MCG-5 LF/0.5ML IM SYRINGE
INJECTION | INTRAMUSCULAR | Status: AC
Start: 2022-08-04 — End: 2022-08-04
  Filled 2022-08-04: qty 0.5

## 2022-08-04 MED ORDER — DIPHTH,PERTUSSIS(ACEL),TETANUS 2.5 LF UNIT-8 MCG-5 LF/0.5ML IM SYRINGE
0.5000 mL | INJECTION | INTRAMUSCULAR | Status: AC
Start: 2022-08-05 — End: 2022-08-04
  Administered 2022-08-04: 0.5 mL via INTRAMUSCULAR

## 2022-08-04 MED ORDER — MELOXICAM 15 MG TABLET: 15.0000 mg | ORAL_TABLET | Freq: Every day | ORAL | 0 refills | Status: DC

## 2022-08-04 NOTE — ED Nurses Note (Signed)
Patient states that she was going out for her break, she tripped and fell on her knee and on her right wrist to catch herself, in a pot hole in the parking lot. Rates pain at 4 out of 10. Pain at a minimum until she moves her knee.

## 2022-08-04 NOTE — ED Provider Notes (Signed)
[facility], [facility] Emergency Department  ED Primary Provider Note  History of Present Illness   Chief Complaint   Patient presents with    Fall     Arrival: The patient arrived by Ambulance    Melissa Hale is a 46 y.o. female who had concerns including Fall. Pt states she fell left knee pain rt wrist pain denies loc.     Review of Systems   Constitutional: No fever, chills or weakness   Skin: No rash or diaphoresis  HENT: No headaches, or congestion  Eyes: No vision changes or photophobia   Cardio: No chest pain, palpitations or leg swelling   Respiratory: No cough, wheezing or SOB  GI:  No nausea, vomiting or stool changes  GU:  No dysuria, hematuria, or increased frequency  MSK: No muscle aches,+ pain and injury  joint no  back pain  Neuro: No seizures, LOC, numbness, tingling, or focal weakness  Psychiatric: No depression, SI or substance abuse  All other systems reviewed and are negative.    Historical Data   History Reviewed This Encounter: all noted and reviewed    Physical Exam   ED Triage Vitals [08/04/22 2159]   BP (Non-Invasive) 139/89   Heart Rate 87   Respiratory Rate 18   Temperature 36.8 C (98.2 F)   SpO2 98 %   Weight 82.1 kg (181 lb)   Height 1.651 m (5\' 5" )     Constitutional:  46 y.o. female who appears in no distress. Normal color, no cyanosis.   HENT:   Head: Normocephalic and atraumatic.   Mouth/Throat: Oropharynx is clear and moist.   Eyes: EOMI, PERRL   Neck: Trachea midline. Neck supple.  Cardiovascular: RRR, No murmurs, rubs or gallops. Intact distal pulses.  Pulmonary/Chest: BS equal bilaterally. No respiratory distress. No wheezes, rales or chest tenderness.   Abdominal: Bowel sounds present and normal. Abdomen soft, no tenderness, no rebound and no guarding.  Back: No midline spinal tenderness, no paraspinal tenderness, no CVA tenderness.           Musculoskeletal:  mod left medial knee edema, tenderness abrasion small, rt wrist tenderness, no  deformity.  Skin: warm and dry. No rash, erythema, pallor or cyanosis  Psychiatric: normal mood and affect. Behavior is normal.   Neurological: Patient keenly alert and responsive, easily able to raise eyebrows, facial muscles/expressions symmetric, speaking in fluent sentences, moving all extremities equally and fully, normal gait  Patient Data   Labs Ordered/Reviewed - No data to display  XR KNEE LEFT 4 OR MORE VIEWS   Final Result by Edi, Radresults In (08/15 2245)   No fracture                Radiologist location ID: [id number]         XR WRIST RIGHT   Final Result by Edi, Radresults In (08/15 2244)   No acute bony injury on today's images                Radiologist location ID: [id number]           Medical Decision Making   Diff dx of fx sprain strain effusion          Medications Administered in the ED   ketorolac (TORADOL) 60mg /2 mL IM injection (has no administration in time range)   diphtheria, pertussis-acell, tetanus (BOOSTRIX) IM injection (has no administration in time range)     Clinical Impression   Contusion of left  knee, initial encounter (Primary)   Wrist sprain, right, initial encounter       Disposition: Discharged

## 2022-08-04 NOTE — ED Nurses Note (Signed)
Patient discharged home with family.  AVS reviewed with patient/care giver.  A written copy of the AVS and discharge instructions was given to the patient/care giver. Scripts handed to patient/care giver. Questions sufficiently answered as needed.  Patient/care giver encouraged to follow up with PCP as indicated.  In the event of an emergency, patient/care giver instructed to call 911 or go to the nearest emergency room.

## 2022-08-04 NOTE — ED Triage Notes (Signed)
EMS reports stepped into a pothole in the parking lot , c/o left knee pain , right wrist pain. VSS en route

## 2022-10-01 ENCOUNTER — Other Ambulatory Visit (HOSPITAL_COMMUNITY): Payer: Self-pay | Admitting: NURSE PRACTITIONER

## 2022-10-01 DIAGNOSIS — R1011 Right upper quadrant pain: Secondary | ICD-10-CM

## 2022-10-28 ENCOUNTER — Inpatient Hospital Stay
Admission: RE | Admit: 2022-10-28 | Discharge: 2022-10-28 | Disposition: A | Discharge status: Home / Self Care | Payer: BC Managed Care – PPO | Source: Ambulatory Visit | Attending: NURSE PRACTITIONER | Admitting: NURSE PRACTITIONER

## 2022-10-28 ENCOUNTER — Other Ambulatory Visit: Payer: Self-pay

## 2022-10-28 DIAGNOSIS — R1011 Right upper quadrant pain: Secondary | ICD-10-CM | POA: Insufficient documentation

## 2022-10-28 MED ORDER — BARIUM SULFATE 2 % (W/V) ORAL SUSPENSION
450.0000 mL | ORAL | Status: AC
Start: 2022-10-28 — End: 2022-10-28
  Administered 2022-10-28: 450 mL via ORAL

## 2022-10-28 MED ORDER — IOHEXOL 350 MG IODINE/ML INTRAVENOUS SOLUTION
100.0000 mL | INTRAVENOUS | Status: AC
Start: 2022-10-28 — End: 2022-10-28
  Administered 2022-10-28: 75 mL via INTRAVENOUS

## 2023-03-11 ENCOUNTER — Other Ambulatory Visit (HOSPITAL_COMMUNITY): Payer: Self-pay | Admitting: NURSE PRACTITIONER

## 2023-03-11 DIAGNOSIS — Z1231 Encounter for screening mammogram for malignant neoplasm of breast: Secondary | ICD-10-CM

## 2023-03-16 ENCOUNTER — Encounter (HOSPITAL_COMMUNITY): Payer: Self-pay

## 2023-03-19 ENCOUNTER — Encounter (HOSPITAL_COMMUNITY): Payer: Self-pay

## 2023-04-06 ENCOUNTER — Ambulatory Visit (HOSPITAL_COMMUNITY): Payer: Self-pay

## 2023-04-20 ENCOUNTER — Encounter (HOSPITAL_BASED_OUTPATIENT_CLINIC_OR_DEPARTMENT_OTHER): Payer: Self-pay

## 2023-04-20 ENCOUNTER — Emergency Department (HOSPITAL_BASED_OUTPATIENT_CLINIC_OR_DEPARTMENT_OTHER): Payer: BC Managed Care – PPO

## 2023-04-20 ENCOUNTER — Emergency Department
Admission: EM | Admit: 2023-04-20 | Discharge: 2023-04-20 | Disposition: A | Discharge status: Home / Self Care | Payer: BC Managed Care – PPO | Attending: Emergency Medicine | Admitting: Emergency Medicine

## 2023-04-20 ENCOUNTER — Other Ambulatory Visit: Payer: Self-pay

## 2023-04-20 DIAGNOSIS — J40 Bronchitis, not specified as acute or chronic: Secondary | ICD-10-CM | POA: Insufficient documentation

## 2023-04-20 DIAGNOSIS — J069 Acute upper respiratory infection, unspecified: Secondary | ICD-10-CM | POA: Insufficient documentation

## 2023-04-20 DIAGNOSIS — Z1152 Encounter for screening for COVID-19: Secondary | ICD-10-CM | POA: Insufficient documentation

## 2023-04-20 LAB — COVID-19, FLU A/B, RSV RAPID BY PCR
INFLUENZA VIRUS TYPE A: NOT DETECTED
INFLUENZA VIRUS TYPE B: NOT DETECTED
RESPIRATORY SYNCTIAL VIRUS (RSV): NOT DETECTED
SARS-CoV-2: NOT DETECTED

## 2023-04-20 MED ORDER — AZITHROMYCIN 250 MG TABLET
ORAL_TABLET | ORAL | Status: AC
Start: 2023-04-20 — End: 2023-04-20
  Filled 2023-04-20: qty 2

## 2023-04-20 MED ORDER — IPRATROPIUM 0.5 MG-ALBUTEROL 3 MG (2.5 MG BASE)/3 ML NEBULIZATION SOLN
INHALATION_SOLUTION | RESPIRATORY_TRACT | Status: AC
Start: 2023-04-20 — End: 2023-04-20
  Filled 2023-04-20: qty 3

## 2023-04-20 MED ORDER — KETOROLAC 60 MG/2 ML INTRAMUSCULAR SOLUTION
60.0000 mg | INTRAMUSCULAR | Status: AC
Start: 2023-04-20 — End: 2023-04-20
  Administered 2023-04-20: 60 mg via INTRAMUSCULAR

## 2023-04-20 MED ORDER — IPRATROPIUM 20 MCG-ALBUTEROL 100 MCG/ACTUATION MIST FOR INHALATION
1.0000 | Freq: Four times a day (QID) | RESPIRATORY_TRACT | 0 refills | Status: DC
Start: 2023-04-20 — End: 2024-01-28

## 2023-04-20 MED ORDER — AZITHROMYCIN 250 MG TABLET
500.0000 mg | ORAL_TABLET | ORAL | Status: AC
Start: 2023-04-20 — End: 2023-04-20
  Administered 2023-04-20: 500 mg via ORAL

## 2023-04-20 MED ORDER — METHYLPREDNISOLONE ACETATE 80 MG/ML SUSPENSION FOR INJECTION
80.0000 mg | Freq: Once | INTRAMUSCULAR | Status: AC
Start: 2023-04-20 — End: 2023-04-20
  Administered 2023-04-20: 80 mg via INTRAMUSCULAR

## 2023-04-20 MED ORDER — BENZONATATE 100 MG CAPSULE
100.0000 mg | ORAL_CAPSULE | ORAL | Status: AC
Start: 2023-04-20 — End: 2023-04-20
  Administered 2023-04-20: 100 mg via ORAL

## 2023-04-20 MED ORDER — PREDNISONE 50 MG TABLET
50.0000 mg | ORAL_TABLET | Freq: Every day | ORAL | 0 refills | Status: AC
Start: 2023-04-20 — End: 2023-04-25

## 2023-04-20 MED ORDER — KETOROLAC 10 MG TABLET
10.0000 mg | ORAL_TABLET | Freq: Four times a day (QID) | ORAL | 0 refills | Status: DC | PRN
Start: 2023-04-20 — End: 2024-01-28

## 2023-04-20 MED ORDER — AZITHROMYCIN 500 MG TABLET
500.0000 mg | ORAL_TABLET | ORAL | 0 refills | Status: DC
Start: 2023-04-20 — End: 2024-01-28

## 2023-04-20 MED ORDER — BENZONATATE 100 MG CAPSULE
ORAL_CAPSULE | ORAL | Status: AC
Start: 2023-04-20 — End: 2023-04-20
  Filled 2023-04-20: qty 1

## 2023-04-20 MED ORDER — IPRATROPIUM 0.5 MG-ALBUTEROL 3 MG (2.5 MG BASE)/3 ML NEBULIZATION SOLN
3.0000 mL | INHALATION_SOLUTION | RESPIRATORY_TRACT | Status: AC
Start: 2023-04-20 — End: 2023-04-20
  Administered 2023-04-20: 3 mL via RESPIRATORY_TRACT

## 2023-04-20 MED ORDER — METHYLPREDNISOLONE ACETATE 80 MG/ML SUSPENSION FOR INJECTION
INTRAMUSCULAR | Status: AC
Start: 2023-04-20 — End: 2023-04-20
  Filled 2023-04-20: qty 1

## 2023-04-20 MED ORDER — KETOROLAC 60 MG/2 ML INTRAMUSCULAR SOLUTION
INTRAMUSCULAR | Status: AC
Start: 2023-04-20 — End: 2023-04-20
  Filled 2023-04-20: qty 2

## 2023-04-20 NOTE — ED Nurses Note (Signed)
Pt awake alert. Skin warm and dry. Respirations even and unlabored. No distress noted. 02 sat 97% . Pt DC home ambulatory . Prescriptions x 4 e-scribed. Verbal and written instructions given. Pt voices understanding.

## 2023-04-20 NOTE — ED Provider Notes (Signed)
[facility], [facility] Emergency Department  ED Primary Provider Note  History of Present Illness   Chief Complaint   Patient presents with    Shortness of Breath     Melissa Hale is a 47 y.o. female who had concerns including Shortness of Breath.  Arrival: The patient arrived by Car complaining of coughing nonproductive over the past several weeks.  Patient was initially given Augmentin and ran out 3 days ago.  Patient states she is having pain in her right lower back every time she takes a deep breath.  She feels like she has pneumonia again.  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  Patient denies any fever chills.  She is complaining of feeling achy all over.  She denies any nasal congestion.  No sore throat or earaches.  No chest pain or shortness breath.    HPI  Review of Systems   Review of Systems   Constitutional:  Positive for activity change and appetite change. Negative for chills and fever.   HENT:  Negative for ear pain and sore throat.    Eyes:  Negative for pain and visual disturbance.   Respiratory:  Positive for cough. Negative for shortness of breath.    Cardiovascular:  Negative for chest pain and palpitations.   Gastrointestinal:  Negative for abdominal pain and vomiting.   Genitourinary:  Negative for dysuria and hematuria.   Musculoskeletal:  Positive for arthralgias and myalgias. Negative for back pain.   Skin:  Negative for color change and rash.   Neurological:  Negative for seizures and syncope.   All other systems reviewed and are negative.     Historical Data   History Reviewed This Encounter:     Physical Exam   ED Triage Vitals [04/20/23 1042]   BP (Non-Invasive) (!) 139/96   Heart Rate 85   Respiratory Rate 18   Temperature 36.3 C (97.4 F)   SpO2 97 %   Weight 87.1 kg (192 lb)   Height 1.651 m (5\' 5" )     Physical Exam  Vitals and nursing note reviewed.   Constitutional:       General: She is not in acute distress.     Appearance: She is  well-developed. She is obese.   HENT:      Head: Normocephalic and atraumatic.      Right Ear: External ear normal.      Left Ear: External ear normal.      Nose: Nose normal.      Mouth/Throat:      Mouth: Mucous membranes are dry.   Eyes:      Extraocular Movements: Extraocular movements intact.      Conjunctiva/sclera: Conjunctivae normal.      Pupils: Pupils are equal, round, and reactive to light.   Cardiovascular:      Rate and Rhythm: Normal rate and regular rhythm.      Pulses: Normal pulses.      Heart sounds: Normal heart sounds. No murmur heard.  Pulmonary:      Effort: Pulmonary effort is normal. No respiratory distress.      Breath sounds: Wheezing present.      Comments: Inspiratory wheezing throughout.  Moving good amount of air.  Abdominal:      General: Bowel sounds are normal.      Palpations: Abdomen is soft.      Tenderness: There is no abdominal tenderness.   Musculoskeletal:  General: No swelling. Normal range of motion.      Cervical back: Normal range of motion and neck supple.   Skin:     General: Skin is warm and dry.      Capillary Refill: Capillary refill takes less than 2 seconds.   Neurological:      General: No focal deficit present.      Mental Status: She is alert and oriented to person, place, and time.   Psychiatric:         Mood and Affect: Mood normal.         Behavior: Behavior normal.         Thought Content: Thought content normal.       Patient Data     Labs Ordered/Reviewed   COVID-19, FLU A/B, RSV RAPID BY PCR - Normal    Narrative:     Results are for the simultaneous qualitative identification of SARS-CoV-2 (formerly 2019-nCoV), Influenza A, Influenza B, and RSV RNA. These etiologic agents are generally detectable in nasopharyngeal and nasal swabs during the ACUTE PHASE of infection. Hence, this test is intended to be performed on respiratory specimens collected from individuals with signs and symptoms of upper respiratory tract infection who meet Centers for  Disease Control and Prevention (CDC) clinical and/or epidemiological criteria for Coronavirus Disease 2019 (COVID-19) testing. CDC COVID-19 criteria for testing on human specimens is available at [facility] webpage information for Healthcare Professionals: Coronavirus Disease 2019 (COVID-19) ([employer]).     False-negative results may occur if the virus has genomic mutations, insertions, deletions, or rearrangements or if performed very early in the course of illness. Otherwise, negative results indicate virus specific RNA targets are not detected, however negative results do not preclude SARS-CoV-2 infection/COVID-19, Influenza, or Respiratory syncytial virus infection. Results should not be used as the sole basis for patient management decisions. Negative results must be combined with clinical observations, patient history, and epidemiological information. If upper respiratory tract infection is still suspected based on exposure history together with other clinical findings, re-testing should be considered.    Disclaimer:   This assay has been authorized by FDA under an Emergency Use Authorization for use in laboratories certified under the Clinical Laboratory Improvement Amendments of 1988 (CLIA), 42 U.S.C. [phone], to perform high complexity tests. The impacts of vaccines, antiviral therapeutics, antibiotics, chemotherapeutic or immunosuppressant drugs have not been evaluated.     Test methodology:   Cepheid Xpert Xpress SARS-CoV-2/Flu/RSV Assay real-time polymerase chain reaction (RT-PCR) test on the GeneXpert Dx and Xpert Xpress systems.     XR CHEST PA AND LATERAL   Final Result by Edi, Radresults In (04/30 1117)   NO ACUTE FINDINGS.         Radiologist location ID: [id number]           Medical Decision Making        Medical Decision Making  Patient is a 47 year old white female complaining of coughing nonproductive over the past several weeks.  Patient was treated by  her PMD with Augmentin for 10 days.  Patient states it ran out 3 days ago.  She now is complaining of coughing but unable to get anything up.  Patient denies any fever chills.  No chest pain or shortness breath.  Patient is complaining of aches and pains all over.  Patient denies any abdominal pain.  Patient will have a chest x-ray.  She will be given a DuoNeb as well as Depo-Medrol shot.  Patient will have a chest x-ray done as  well as a for Plex.  Patient will be treated for results and then discharged home.  Patient will follow up with her primary care physician once again in the next 3-4 days.    Amount and/or Complexity of Data Reviewed  Radiology: ordered.    Risk  Prescription drug management.             Medications Administered in the ED   ketorolac (TORADOL) 60mg /2 mL IM injection (has no administration in time range)   azithromycin (ZITHROMAX) tablet (has no administration in time range)   benzonatate (TESSALON) capsule (has no administration in time range)   methylPREDNISolone acetate (DEPO-medrol) 80 mg/mL injection (80 mg IntraMUSCULAR Given 04/20/23 1111)   ipratropium-albuterol 0.5 mg-3 mg(2.5 mg base)/3 mL Solution for Nebulization (3 mL Nebulization Given 04/20/23 1104)     Clinical Impression   Bronchitis (Primary)   Upper respiratory tract infection, unspecified type       Disposition: Discharged               Clinical Impression   Bronchitis (Primary)   Upper respiratory tract infection, unspecified type       Current Discharge Medication List        START taking these medications    Details   azithromycin (ZITHROMAX) 500 mg Oral Tablet Take 1 Tablet (500 mg total) by mouth Every 24 hours  Qty: 6 Tablet, Refills: 0      ipratropium-albuteroL (COMBIVENT RESPIMAT) 20-100 mcg/actuation Inhalation Mist Take 1 Puff by inhalation Four times a day - before meals and bedtime for 30 days  Qty: 4 g, Refills: 0      ketorolac tromethamine (TORADOL) 10 mg Oral Tablet Take 1 Tablet (10 mg total) by mouth Every  6 hours as needed for Pain  Qty: 20 Tablet, Refills: 0      predniSONE (DELTASONE) 50 mg Oral Tablet Take 1 Tablet (50 mg total) by mouth Once a day for 5 days  Qty: 5 Tablet, Refills: 0

## 2023-04-20 NOTE — ED Nurses Note (Signed)
[facility], [facility] Emergency Department  Peer Recovery Coach Assessment    Initial Evaluation  Referred by:: Nurse  Location of Evaluation: Emergency Department  How many times in the last 12 months have you been to the ED?: 2  Have you ever served or are you currently serving in the Armed Forces?: No             Substance Use History  Patient current substance use status: Patient states that she smokes marijuana from time to time    Prior treatment history?: No    Currently enrolled in substance use program?: No    Within the last 30 days, what substances has the patient used?: Marijuana  Patient's age at first substance use?: 15-20  Drug route of administration: Smoked    Has the patient ever had sustained abstinence?: No              Family, Social, Home & Safety History  Marital Status: Single            Need to improve relationships with family?: No    Social network: Immediate family, Substance using peers, Non-substance using peers/friends/other    Current living situation: Independent  Any help needed with the following?: None  Contact phone number for the patient: [phone]       Has the patient had any legal issues within the past 30 days?: None         Employment            Engagement  Readiness ruler: 1    Brief Intervention  Discussed plan to reduce/quit substance use?: Yes  Discussed willingness to enter treatment?: Yes  Indicated patient's stage of change:: 1 - Precontemplation    Patient seen by Peer Recovery Coach and is a candidate for buprenorphine administration in the ED. Patient needs assessment for bup treatment.: No    Plan  Was the patient referred to treatment?: No    Was patient referred to physician for Buprenorphine Assessment in the ED?: No    Did patient receive Narcan in the ED?: No         Follow-up  Patient admitted for treatment?: No        Need for additional follow-up?: No       Elzie Rings, Peer Recovery Coach 04/20/2023 12:04

## 2023-04-20 NOTE — ED Triage Notes (Signed)
Patient states she was on antibiotics for a sinus infection which she finished on Wednesday and by Saturday she was running fever, stuffy nose, cough and SOB.  States she thinks she has pneumonia.

## 2023-04-20 NOTE — ED Nurses Note (Signed)
Increased air exchange noted. 02 sat 97% . Pt states, "I feel like I am breathing better now."

## 2023-04-20 NOTE — ED Nurses Note (Signed)
Pt states, "I have been sick for two weeks. I am short of breath. It feels like I am breathing around something. I just can't get enough air in. I finished my Augmentin and two days later I started running a fever of 102 again. 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 I get pneumonia easy. My back hurts like when I had pneumonia." Upper back pain.   Expiratory wheezing noted  at upper lobes. Demolished lower lobes.

## 2023-04-27 ENCOUNTER — Ambulatory Visit (HOSPITAL_COMMUNITY): Payer: Self-pay

## 2023-04-28 ENCOUNTER — Inpatient Hospital Stay
Admission: RE | Admit: 2023-04-28 | Discharge: 2023-04-28 | Disposition: A | Discharge status: Home / Self Care | Payer: BC Managed Care – PPO | Source: Ambulatory Visit | Attending: NURSE PRACTITIONER | Admitting: NURSE PRACTITIONER

## 2023-04-28 ENCOUNTER — Other Ambulatory Visit: Payer: Self-pay

## 2023-04-28 ENCOUNTER — Other Ambulatory Visit (HOSPITAL_COMMUNITY): Payer: Self-pay | Admitting: NURSE PRACTITIONER

## 2023-04-28 DIAGNOSIS — J159 Unspecified bacterial pneumonia: Secondary | ICD-10-CM | POA: Insufficient documentation

## 2023-04-28 MED ORDER — IOHEXOL 350 MG IODINE/ML INTRAVENOUS SOLUTION
100.0000 mL | INTRAVENOUS | Status: AC
Start: 2023-04-28 — End: 2023-04-28
  Administered 2023-04-28: 100 mL via INTRAVENOUS

## 2023-05-24 ENCOUNTER — Other Ambulatory Visit: Payer: Self-pay

## 2023-05-24 ENCOUNTER — Inpatient Hospital Stay
Admission: RE | Admit: 2023-05-24 | Discharge: 2023-05-24 | Disposition: A | Discharge status: Home / Self Care | Payer: BC Managed Care – PPO | Source: Ambulatory Visit | Attending: NURSE PRACTITIONER | Admitting: NURSE PRACTITIONER

## 2023-05-24 ENCOUNTER — Encounter (HOSPITAL_COMMUNITY): Payer: Self-pay

## 2023-05-24 DIAGNOSIS — Z1231 Encounter for screening mammogram for malignant neoplasm of breast: Secondary | ICD-10-CM | POA: Insufficient documentation

## 2023-09-14 ENCOUNTER — Other Ambulatory Visit (HOSPITAL_COMMUNITY): Payer: Self-pay | Admitting: NURSE PRACTITIONER

## 2023-09-14 DIAGNOSIS — S22000A Wedge compression fracture of unspecified thoracic vertebra, initial encounter for closed fracture: Secondary | ICD-10-CM

## 2023-10-07 ENCOUNTER — Inpatient Hospital Stay
Admission: RE | Admit: 2023-10-07 | Discharge: 2023-10-07 | Disposition: A | Discharge status: Home / Self Care | Payer: BC Managed Care – PPO | Source: Ambulatory Visit | Attending: NURSE PRACTITIONER | Admitting: NURSE PRACTITIONER

## 2023-10-07 ENCOUNTER — Other Ambulatory Visit: Payer: Self-pay

## 2023-10-07 ENCOUNTER — Inpatient Hospital Stay (HOSPITAL_COMMUNITY)
Admission: RE | Admit: 2023-10-07 | Discharge: 2023-10-07 | Disposition: A | Discharge status: Home / Self Care | Payer: BC Managed Care – PPO | Source: Ambulatory Visit | Attending: NURSE PRACTITIONER | Admitting: NURSE PRACTITIONER

## 2023-10-07 DIAGNOSIS — S22000A Wedge compression fracture of unspecified thoracic vertebra, initial encounter for closed fracture: Secondary | ICD-10-CM | POA: Insufficient documentation

## 2023-10-14 IMAGING — MR MRI KNEE RT W/O CONTRAST
5 series · 40 of 40 positions shown · IV contrast (gadolinium)
Comparison: None previous available.

﻿EXAM:  [id number]   MRI KNEE RT W/O CONTRAST
INDICATION: 47-year-old with pain and swelling of the right knee with diminished range of motion. Prior history of MCL repair.
TECHNIQUE: Multiplanar, multisequential MRI of the right knee was performed without gadolinium contrast.

[Series 5: PD fat-sat · axial · right · 4.5mm · 0.53mm/px · z∈[-89,+56]mm · 8 of 30 slices shown (1 of 3)]
[im 1/30]
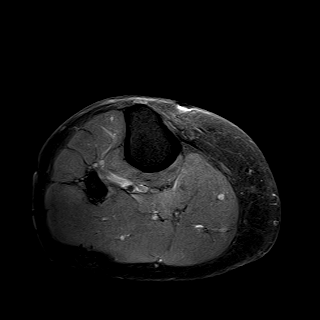
[im 5/30]
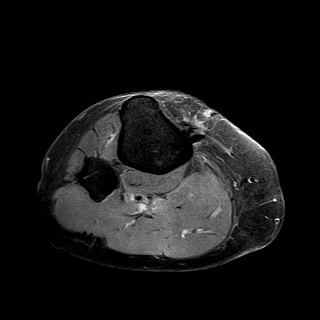
[im 9/30]
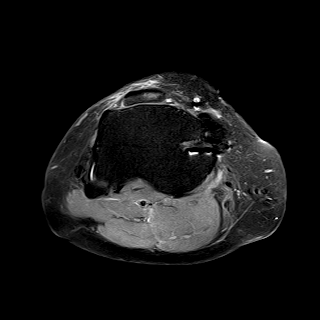
[im 13/30]
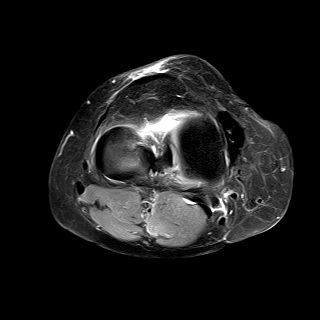
[im 17/30]
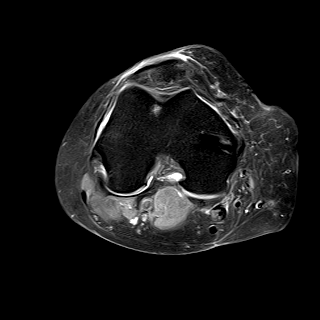
[im 21/30]
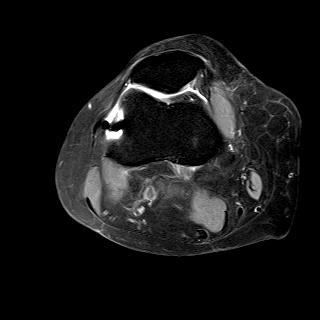
[im 25/30]
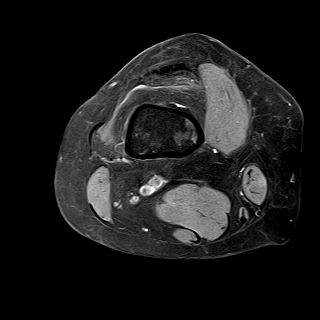
[im 30/30]
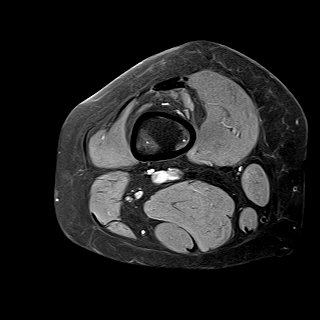

[Series 6: PD fat-sat · sagittal · right · 4.0mm · 0.50mm/px · 8 of 26 slices shown (2 of 3)]
[im 1/26]
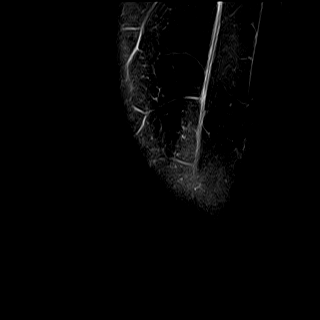
[im 4/26]
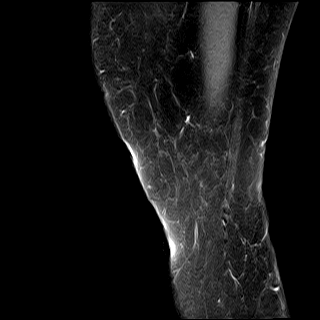
[im 8/26]
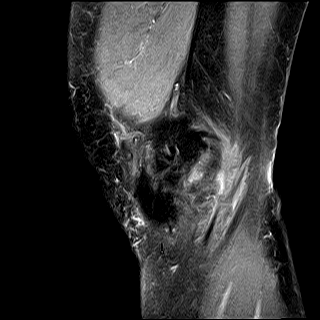
[im 11/26]
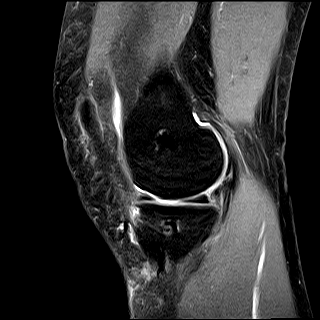
[im 15/26]
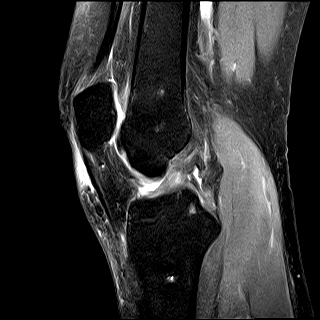
[im 18/26]
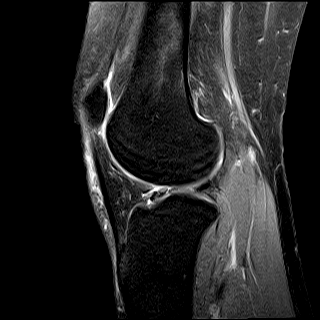
[im 22/26]
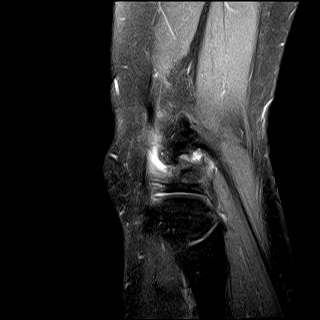
[im 26/26]
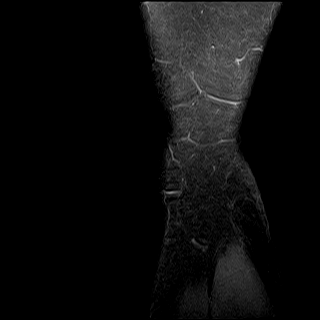

[Series 7: T1 · sagittal · right · 4.0mm · 0.42mm/px · 8 of 26 slices shown]
[im 1/26]
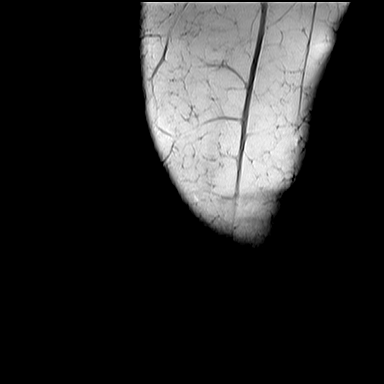
[im 4/26]
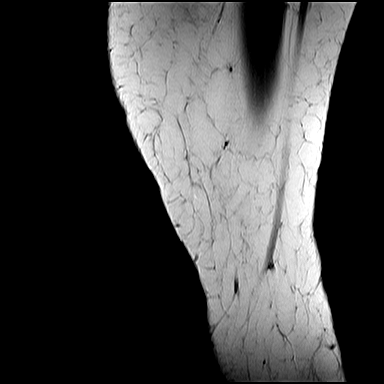
[im 8/26]
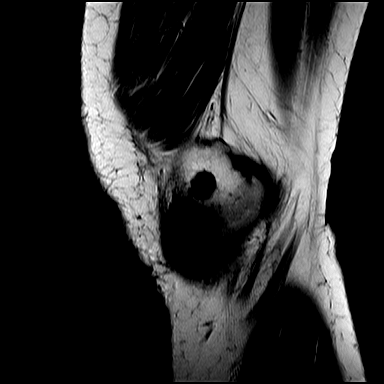
[im 11/26]
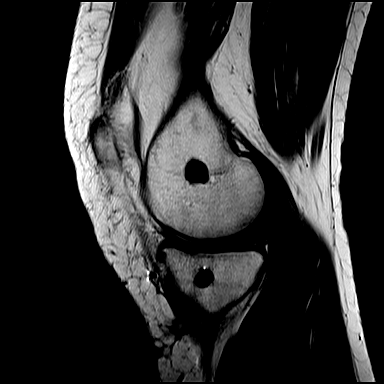
[im 15/26]
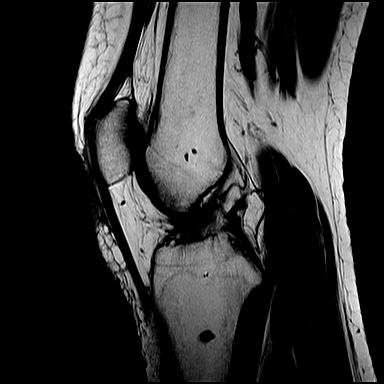
[im 18/26]
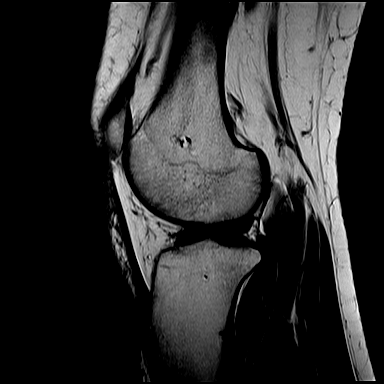
[im 22/26]
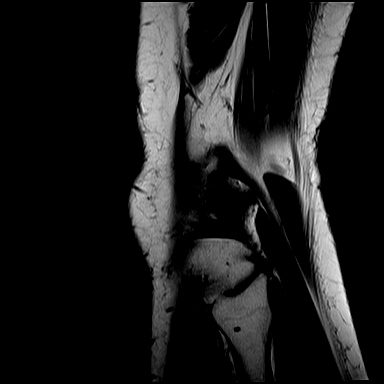
[im 26/26]
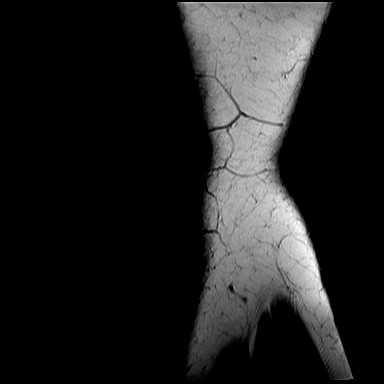

[Series 8: STIR · coronal · right · 4.0mm · 0.53mm/px · 8 of 25 slices shown]
[im 1/25]
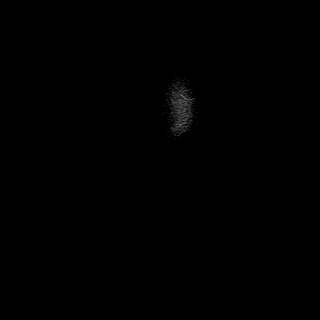
[im 4/25]
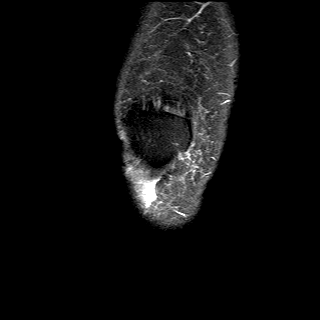
[im 7/25]
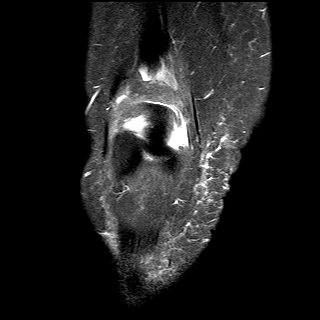
[im 11/25]
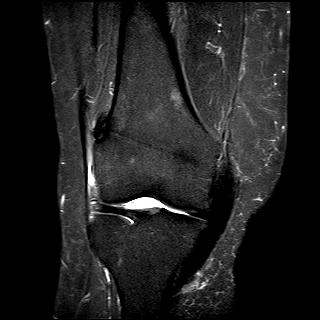
[im 14/25]
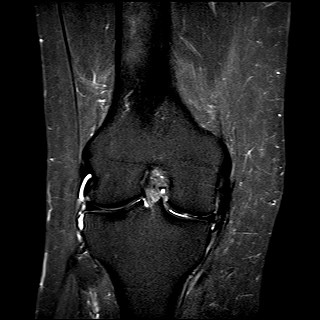
[im 18/25]
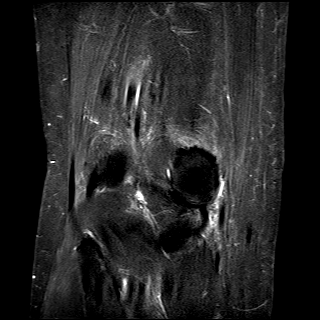
[im 21/25]
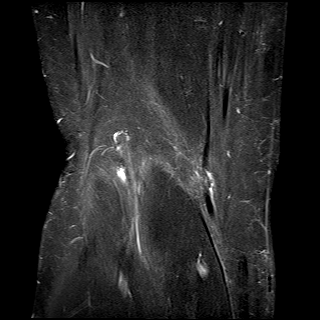
[im 25/25]
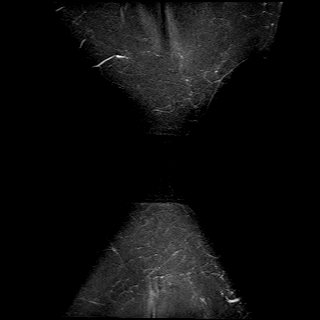

[Series 9: PD fat-sat · coronal · right · 4.0mm · 0.53mm/px · 8 of 25 slices shown (3 of 3)]
[im 1/25]
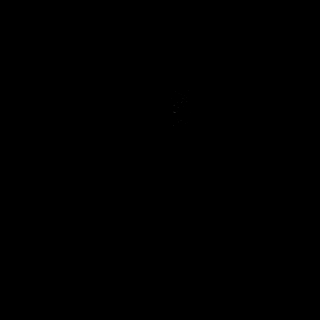
[im 4/25]
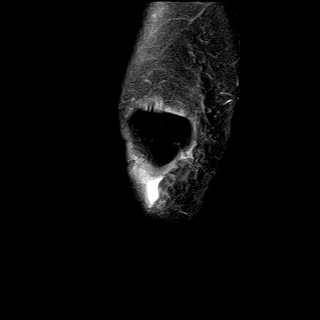
[im 7/25]
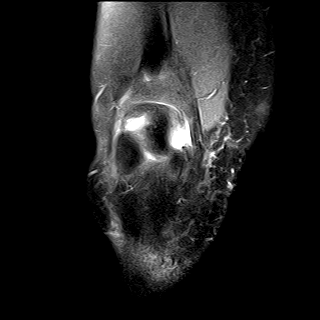
[im 11/25]
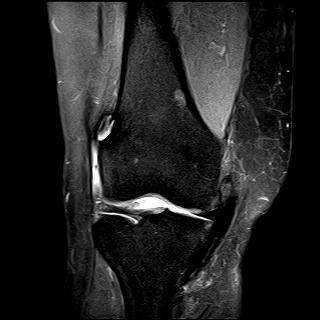
[im 14/25]
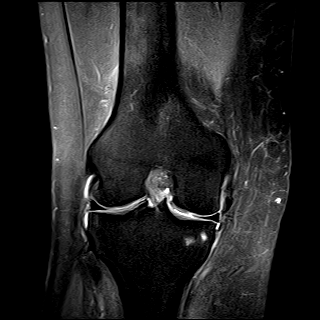
[im 18/25]
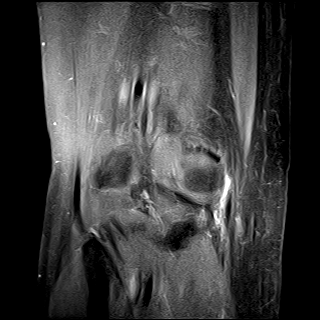
[im 21/25]
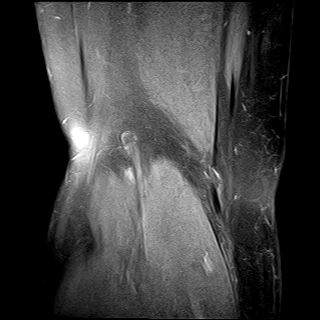
[im 25/25]
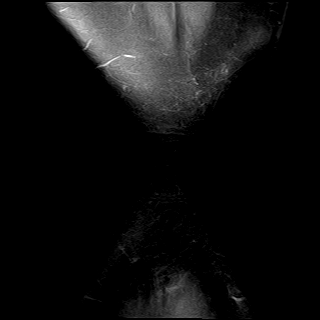

[40 of 40 positions shown; findings below may reference images not displayed]

FINDINGS: No acute bony lesions are seen at the right knee.  Postoperative changes of medial collateral ligament repair are noted.

Grade 2 to grade 3 degenerative changes of medial articular cartilage are noted.  Medial meniscus shows no acute findings.  The lateral meniscus and lateral articular cartilage are normal.  Postsurgical changes of repair of the lateral retinaculum of the patellofemoral joint are noted.  Patellar articular cartilage shows minimal grade 1 degenerative change. 

The anterior cruciate ligament is intact in the sagittal and coronal images.  However, the posterior cruciate ligament appears to be disrupted in the coronal and sagittal images with hypertrophy of the lateral meniscal femoral ligament between the lateral meniscus and lateral femoral condyle.

Quadriceps tendon and patellar tendon are intact. Mild patella alta.  Infrapatellar bursitis.
IMPRESSION: 1. No acute bone changes at the right knee. 

2. Postoperative changes of MCL repair and surgery over the lateral retinaculum of the patellofemoral articulation.

3. Disruption in the outline of posterior cruciate ligament in the sagittal and coronal projections with compensatory hypertrophy of the meniscal femoral ligament is noted.

4. Moderate degenerative changes of medial articular cartilage and patellar articular cartilage.

5. Infrapatellar bursitis.  Patella alta.

## 2023-12-16 ENCOUNTER — Encounter (HOSPITAL_COMMUNITY): Payer: Self-pay

## 2023-12-16 ENCOUNTER — Other Ambulatory Visit (HOSPITAL_COMMUNITY): Payer: Self-pay | Admitting: NURSE PRACTITIONER

## 2023-12-16 DIAGNOSIS — R42 Dizziness and giddiness: Secondary | ICD-10-CM

## 2023-12-16 DIAGNOSIS — R2689 Other abnormalities of gait and mobility: Secondary | ICD-10-CM

## 2024-01-09 ENCOUNTER — Other Ambulatory Visit: Payer: Self-pay

## 2024-01-09 ENCOUNTER — Ambulatory Visit
Admission: RE | Admit: 2024-01-09 | Discharge: 2024-01-09 | Disposition: A | Discharge status: Home / Self Care | Payer: BC Managed Care – PPO | Source: Ambulatory Visit | Attending: NURSE PRACTITIONER | Admitting: NURSE PRACTITIONER

## 2024-01-09 DIAGNOSIS — R42 Dizziness and giddiness: Secondary | ICD-10-CM | POA: Insufficient documentation

## 2024-01-09 DIAGNOSIS — R2689 Other abnormalities of gait and mobility: Secondary | ICD-10-CM | POA: Insufficient documentation

## 2024-01-27 ENCOUNTER — Other Ambulatory Visit (HOSPITAL_COMMUNITY): Payer: Self-pay | Admitting: NURSE PRACTITIONER

## 2024-01-27 DIAGNOSIS — S22000A Wedge compression fracture of unspecified thoracic vertebra, initial encounter for closed fracture: Secondary | ICD-10-CM

## 2024-01-28 ENCOUNTER — Other Ambulatory Visit: Payer: Self-pay

## 2024-01-28 ENCOUNTER — Emergency Department (HOSPITAL_BASED_OUTPATIENT_CLINIC_OR_DEPARTMENT_OTHER): Payer: BC Managed Care – PPO

## 2024-01-28 ENCOUNTER — Encounter (HOSPITAL_BASED_OUTPATIENT_CLINIC_OR_DEPARTMENT_OTHER): Payer: Self-pay

## 2024-01-28 ENCOUNTER — Emergency Department
Admission: EM | Admit: 2024-01-28 | Discharge: 2024-01-28 | Disposition: A | Discharge status: Home / Self Care | Payer: BC Managed Care – PPO | Attending: Family | Admitting: Family

## 2024-01-28 DIAGNOSIS — N83201 Unspecified ovarian cyst, right side: Secondary | ICD-10-CM | POA: Insufficient documentation

## 2024-01-28 DIAGNOSIS — R1011 Right upper quadrant pain: Secondary | ICD-10-CM | POA: Insufficient documentation

## 2024-01-28 DIAGNOSIS — R1031 Right lower quadrant pain: Secondary | ICD-10-CM

## 2024-01-28 DIAGNOSIS — N83202 Unspecified ovarian cyst, left side: Secondary | ICD-10-CM | POA: Insufficient documentation

## 2024-01-28 LAB — CBC WITH DIFF
BASOPHIL #: 0.07 10*3/uL (ref 0.00–0.10)
BASOPHIL %: 1 % (ref 0–1)
EOSINOPHIL #: 0.28 10*3/uL (ref 0.00–0.50)
EOSINOPHIL %: 2 % (ref 1–7)
HCT: 47.4 % — ABNORMAL HIGH (ref 31.2–41.9)
HGB: 14.9 g/dL — ABNORMAL HIGH (ref 10.9–14.3)
LYMPHOCYTE #: 3.8 10*3/uL — ABNORMAL HIGH (ref 1.10–3.10)
LYMPHOCYTE %: 33 % (ref 16–46)
MCH: 31.2 pg (ref 24.7–32.8)
MCHC: 31.5 g/dL — ABNORMAL LOW (ref 32.3–35.6)
MCV: 99 fL — ABNORMAL HIGH (ref 75.5–95.3)
MONOCYTE #: 0.97 10*3/uL — ABNORMAL HIGH (ref 0.20–0.90)
MONOCYTE %: 8 % (ref 4–11)
MPV: 9.4 fL (ref 7.9–10.8)
NEUTROPHIL #: 6.37 10*3/uL (ref 1.90–8.20)
NEUTROPHIL %: 55 % (ref 43–77)
PLATELETS: 386 10*3/uL (ref 140–440)
RBC: 4.79 10*6/uL (ref 3.63–4.92)
RDW: 14.3 % (ref 12.3–17.7)
WBC: 11.5 10*3/uL (ref 3.8–11.8)

## 2024-01-28 LAB — COMPREHENSIVE METABOLIC PANEL, NON-FASTING
ALBUMIN/GLOBULIN RATIO: 1.2 (ref 0.8–1.4)
ALBUMIN: 4.6 g/dL (ref 3.4–5.0)
ALKALINE PHOSPHATASE: 79 U/L (ref 46–116)
ALT (SGPT): 43 U/L (ref <=78)
ANION GAP: 8 mmol/L (ref 4–13)
AST (SGOT): 32 U/L (ref 15–37)
BILIRUBIN TOTAL: 0.6 mg/dL (ref 0.2–1.0)
BUN/CREA RATIO: 16
BUN: 14 mg/dL (ref 7–18)
CALCIUM, CORRECTED: 9.4 mg/dL
CALCIUM: 9.9 mg/dL (ref 8.5–10.1)
CHLORIDE: 103 mmol/L (ref 98–107)
CO2 TOTAL: 28 mmol/L (ref 21–32)
CREATININE: 0.88 mg/dL (ref 0.55–1.02)
ESTIMATED GFR: 82 mL/min/{1.73_m2} (ref >59)
GLOBULIN: 3.9
GLUCOSE: 93 mg/dL (ref 74–106)
OSMOLALITY, CALCULATED: 278 mosm/kg (ref 270–290)
POTASSIUM: 4 mmol/L (ref 3.5–5.1)
PROTEIN TOTAL: 8.5 g/dL — ABNORMAL HIGH (ref 6.4–8.2)
SODIUM: 139 mmol/L (ref 136–145)

## 2024-01-28 MED ORDER — SODIUM CHLORIDE 0.9 % (FLUSH) INJECTION SYRINGE
3.0000 mL | INJECTION | Freq: Three times a day (TID) | INTRAMUSCULAR | Status: DC
Start: 2024-01-28 — End: 2024-01-28
  Administered 2024-01-28: 3 mL

## 2024-01-28 MED ORDER — FAMOTIDINE (PF) 20 MG/2 ML INTRAVENOUS SOLUTION
20.0000 mg | INTRAVENOUS | Status: AC
Start: 2024-01-28 — End: 2024-01-28
  Administered 2024-01-28: 20 mg via INTRAVENOUS

## 2024-01-28 MED ORDER — SODIUM CHLORIDE 0.9 % (FLUSH) INJECTION SYRINGE
3.0000 mL | INJECTION | INTRAMUSCULAR | Status: DC | PRN
Start: 2024-01-28 — End: 2024-01-28

## 2024-01-28 MED ORDER — KETOROLAC 30 MG/ML (1 ML) INJECTION SOLUTION
INTRAMUSCULAR | Status: AC
Start: 2024-01-28 — End: 2024-01-28
  Filled 2024-01-28: qty 1

## 2024-01-28 MED ORDER — IOHEXOL 350 MG IODINE/ML INTRAVENOUS SOLUTION
100.0000 mL | INTRAVENOUS | Status: AC
Start: 2024-01-28 — End: 2024-01-28
  Administered 2024-01-28: 75 mL via INTRAVENOUS

## 2024-01-28 MED ORDER — KETOROLAC 30 MG/ML (1 ML) INJECTION SOLUTION
30.0000 mg | INTRAMUSCULAR | Status: AC
Start: 2024-01-28 — End: 2024-01-28
  Administered 2024-01-28: 30 mg via INTRAVENOUS

## 2024-01-28 MED ORDER — FAMOTIDINE (PF) 20 MG/2 ML INTRAVENOUS SOLUTION
INTRAVENOUS | Status: AC
Start: 2024-01-28 — End: 2024-01-28
  Filled 2024-01-28: qty 2

## 2024-01-28 MED ORDER — ONDANSETRON 4 MG DISINTEGRATING TABLET
4.0000 mg | ORAL_TABLET | Freq: Three times a day (TID) | ORAL | 0 refills | Status: DC | PRN
Start: 2024-01-28 — End: 2024-04-24

## 2024-01-28 MED ORDER — KETOROLAC 10 MG TABLET
10.0000 mg | ORAL_TABLET | Freq: Four times a day (QID) | ORAL | 0 refills | Status: DC | PRN
Start: 2024-01-28 — End: 2024-04-24

## 2024-01-28 NOTE — Discharge Instructions (Signed)
Please follow up for more testing. Return if any concerns avoid straining lifting.

## 2024-01-28 NOTE — ED Notes (Signed)
[facility], [facility] - Emergency Department  Peer Recovery Coach Assessment    Initial Evaluation  Referred by:: Nurse  Location of Evaluation: Emergency Department  How many times in the last 12 months have you been to the ED?: 4  Have you ever served or are you currently serving in the Armed Forces?: No             Substance Use History  Patient current substance use status: Patient states she might smoke marijuanna once every 6 months         Currently enrolled in substance use program?: No    Within the last 30 days, what substances has the patient used?: Marijuana  Patient's age at first substance use?: 12-15  Drug route of administration: Smoked    Has the patient ever had sustained abstinence?: Yes              Family, Social, Home & Safety History  Marital Status: Single    Number of children: 2       Need to improve relationships with family?: No    Social network: Immediate family, Substance using peers, Non-substance using peers/friends/other    Current living situation: With family  Any help needed with the following?: None  Contact phone number for the patient: [phone]       Has the patient had any legal issues within the past 30 days?: None         Employment  Current employment status: Employed  Occupation: Scientist, 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 Patient needs assessment for bup treatment.: No (non opiod user)    Plan  Was the patient referred to treatment?: No    Was patient referred to physician for Buprenorphine Assessment in the ED?: No    Did patient receive Narcan in the  ED?: No    Plan: Additional Comments: Safe harm reduction and long term affects were discussed    Follow-up  Patient admitted for treatment?: No        Need for additional follow-up?: No       Lanice Shirts, Peer Recovery Coach 01/28/2024 14:09

## 2024-01-28 NOTE — ED Provider Notes (Signed)
[facility], [facility] - Emergency Department  ED Primary Provider Note  History of Present Illness   Chief Complaint   Patient presents with    Abdominal Pain     Pt c/o right abdominal pain since last night. States she has a hernia and felt a "pop" while laying on her back last night. Pain has been worsening since. States it felt like "an electric shock through my whole body"     Arrival: The patient arrived by Car  Melissa Hale is a 48 y.o. female who had concerns including Abdominal Pain. Pt states ruq and RLQ pain since last pm felt like hernia came out and didn't go back in right. Felt an electrical shock in abd . Denies nvd fever nor bowel changes  Review of Systems   Constitutional: No fever, chills or weakness   Skin: No rash or diaphoresis  HENT: No headaches, or congestion  Eyes: No vision changes or photophobia   Cardio: No chest pain, palpitations or leg swelling   Respiratory: No cough, wheezing or SOB  GI:  No nausea, vomiting or stool changes+ abd pain.   GU:  No dysuria, hematuria, or increased frequency  MSK: No muscle aches, joint or back pain  Neuro: No seizures, LOC, numbness, tingling, or focal weakness  Psychiatric: No depression, SI or substance abuse  All other systems reviewed and are negative.    History Reviewed This Encounter: all noted and reviewed.     Physical Exam   ED Triage Vitals [01/28/24 1308]   BP (Non-Invasive) (!) 151/102   Heart Rate 91   Respiratory Rate 18   Temperature 36.8 C (98.2 F)   SpO2 95 %   Weight 86.2 kg (190 lb)   Height 1.651 m (5\' 5" )     Constitutional:  48 y.o. female who appears in no distress. Normal color, no cyanosis.   HENT:   Head: Normocephalic and atraumatic.   Mouth/Throat: Oropharynx is clear and moist.   Eyes: EOMI, PERRL   Neck: Trachea midline. Neck supple.  Cardiovascular: RRR, No murmurs, rubs or gallops. Intact distal pulses.  Pulmonary/Chest: BS equal bilaterally. No respiratory distress. No wheezes, rales or chest  tenderness.   Abdominal: Bowel sounds present and normal. Abdomen soft, + ruq RLQ  tenderness, no rebound and no guarding.  Back: No midline spinal tenderness, no paraspinal tenderness, no CVA tenderness.           Musculoskeletal: No edema, tenderness or deformity.  Skin: warm and dry. No rash, erythema, pallor or cyanosis  Psychiatric: normal mood and affect. Behavior is normal.   Neurological: Patient keenly alert and responsive, easily able to raise eyebrows, facial muscles/expressions symmetric, speaking in fluent sentences, moving all extremities equally and fully, normal gait  Patient Data     Labs Ordered/Reviewed   COMPREHENSIVE METABOLIC PANEL, NON-FASTING - Abnormal; Notable for the following components:       Result Value    PROTEIN TOTAL 8.5 (*)     All other components within normal limits    Narrative:     Estimated Glomerular Filtration Rate (eGFR) is calculated using the CKD-EPI (2021) equation, intended for patients 59 years of age and older. If gender is not documented or "unknown", there will be no eGFR calculation.   CBC WITH DIFF - Abnormal; Notable for the following components:    HGB 14.9 (*)     HCT 47.4 (*)     MCV 99.0 (*)     MCHC  31.5 (*)     LYMPHOCYTE # 3.80 (*)     MONOCYTE # 0.97 (*)     All other components within normal limits   CBC/DIFF    Narrative:     The following orders were created for panel order CBC/DIFF.  Procedure                               Abnormality         Status                     ---------                               -----------         ------                     CBC WITH DIFF[[id number]]                Abnormal            Final result                 Please view results for these tests on the individual orders.     CT ABDOMEN PELVIS W IV CONTRAST   Final Result by Edi, [facility] In (02/07 1449)   THERE ARE BILATERAL OVARIAN CYSTS.          One or more dose reduction techniques were used (e.g., Automated exposure control, adjustment of the mA and/or kV  according to patient size, use of iterative reconstruction technique).         Radiologist location ID: [id number]           Medical Decision Making   Diff dx of hernia. Strangulated hernia. Abd wall strain pain.    Ct shows bil ovarian cyst. Neg abd. No concerning lab values. Felt better after meds.          Medications Administered in the ED   NS flush syringe (3 mL Intracatheter Given 01/28/24 1400)   NS flush syringe (has no administration in time range)   ketorolac (TORADOL) 30 mg/mL injection (30 mg Intravenous Given 01/28/24 1405)   famotidine (PEPCID) 10 mg/mL injection (20 mg Intravenous Given 01/28/24 1405)   iohexol (OMNIPAQUE 350) infusion (75 mL Intravenous Given 01/28/24 1427)     Clinical Impression   Bilateral ovarian cysts (Primary)   RUQ abdominal pain   RLQ abdominal pain       Disposition: Discharged

## 2024-02-02 ENCOUNTER — Ambulatory Visit (INDEPENDENT_AMBULATORY_CARE_PROVIDER_SITE_OTHER): Payer: Self-pay | Admitting: NURSE PRACTITIONER

## 2024-02-02 ENCOUNTER — Encounter (INDEPENDENT_AMBULATORY_CARE_PROVIDER_SITE_OTHER): Payer: Self-pay

## 2024-02-15 ENCOUNTER — Ambulatory Visit (HOSPITAL_COMMUNITY): Payer: Self-pay

## 2024-03-04 ENCOUNTER — Ambulatory Visit: Attending: NURSE PRACTITIONER

## 2024-03-04 ENCOUNTER — Other Ambulatory Visit (HOSPITAL_BASED_OUTPATIENT_CLINIC_OR_DEPARTMENT_OTHER): Payer: Self-pay | Admitting: NURSE PRACTITIONER

## 2024-03-04 ENCOUNTER — Other Ambulatory Visit: Payer: Self-pay

## 2024-03-04 ENCOUNTER — Other Ambulatory Visit (HOSPITAL_COMMUNITY): Payer: Self-pay | Admitting: NURSE PRACTITIONER

## 2024-03-04 ENCOUNTER — Ambulatory Visit (HOSPITAL_BASED_OUTPATIENT_CLINIC_OR_DEPARTMENT_OTHER)
Admission: RE | Admit: 2024-03-04 | Discharge: 2024-03-04 | Disposition: A | Discharge status: Home / Self Care | Source: Ambulatory Visit | Attending: NURSE PRACTITIONER | Admitting: NURSE PRACTITIONER

## 2024-03-04 DIAGNOSIS — M5442 Lumbago with sciatica, left side: Secondary | ICD-10-CM

## 2024-03-04 DIAGNOSIS — Z01818 Encounter for other preprocedural examination: Secondary | ICD-10-CM | POA: Insufficient documentation

## 2024-03-04 LAB — COMPREHENSIVE METABOLIC PANEL, NON-FASTING
ALBUMIN/GLOBULIN RATIO: 1.4 (ref 0.8–1.4)
ALBUMIN: 4.2 g/dL (ref 3.5–5.7)
ALKALINE PHOSPHATASE: 51 U/L (ref 34–104)
ALT (SGPT): 32 U/L (ref 7–52)
ANION GAP: 7 mmol/L (ref 4–13)
AST (SGOT): 29 U/L (ref 13–39)
BILIRUBIN TOTAL: 0.6 mg/dL (ref 0.3–1.0)
BUN/CREA RATIO: 26 — ABNORMAL HIGH (ref 6–22)
BUN: 16 mg/dL (ref 7–25)
CALCIUM, CORRECTED: 9.4 mg/dL (ref 8.9–10.8)
CALCIUM: 9.6 mg/dL (ref 8.6–10.3)
CHLORIDE: 106 mmol/L (ref 98–107)
CO2 TOTAL: 25 mmol/L (ref 21–31)
CREATININE: 0.61 mg/dL (ref 0.60–1.30)
ESTIMATED GFR: 112 mL/min/{1.73_m2} (ref >59)
GLOBULIN: 3 (ref 2.0–3.5)
GLUCOSE: 106 mg/dL (ref 74–109)
OSMOLALITY, CALCULATED: 277 mosm/kg (ref 270–290)
POTASSIUM: 4 mmol/L (ref 3.5–5.1)
PROTEIN TOTAL: 7.2 g/dL (ref 6.4–8.9)
SODIUM: 138 mmol/L (ref 136–145)

## 2024-03-04 LAB — URINALYSIS, MICROSCOPIC
RBCS: 2 /HPF (ref <4)
SQUAMOUS EPITHELIAL: 1 /HPF (ref <28)

## 2024-03-04 LAB — PT/INR
INR: 0.91 (ref 0.84–1.10)
PROTHROMBIN TIME: 10.3 s (ref 9.8–12.7)

## 2024-03-04 LAB — URINALYSIS, MACROSCOPIC
BILIRUBIN: NEGATIVE mg/dL
BLOOD: 0.06 mg/dL — AB
GLUCOSE: NEGATIVE mg/dL
KETONES: NEGATIVE mg/dL
LEUKOCYTES: NEGATIVE WBCs/uL
NITRITE: NEGATIVE
PH: 8 (ref 5.0–9.0)
PROTEIN: NEGATIVE mg/dL
SPECIFIC GRAVITY: 1.018 (ref 1.002–1.030)
UROBILINOGEN: NORMAL mg/dL

## 2024-03-04 LAB — PTT (PARTIAL THROMBOPLASTIN TIME): APTT: 32.2 s (ref 25.0–38.0)

## 2024-03-06 DIAGNOSIS — Z136 Encounter for screening for cardiovascular disorders: Secondary | ICD-10-CM

## 2024-03-06 DIAGNOSIS — R001 Bradycardia, unspecified: Secondary | ICD-10-CM

## 2024-03-06 LAB — ECG 12 LEAD
Atrial Rate: 54 {beats}/min
Calculated P Axis: 57 degrees
Calculated R Axis: 49 degrees
Calculated T Axis: 57 degrees
PR Interval: 176 ms
QRS Duration: 82 ms
QT Interval: 402 ms
QTC Calculation: 381 ms
Ventricular rate: 54 {beats}/min

## 2024-04-24 ENCOUNTER — Encounter (HOSPITAL_BASED_OUTPATIENT_CLINIC_OR_DEPARTMENT_OTHER): Payer: Self-pay

## 2024-04-24 ENCOUNTER — Emergency Department (HOSPITAL_BASED_OUTPATIENT_CLINIC_OR_DEPARTMENT_OTHER)

## 2024-04-24 ENCOUNTER — Emergency Department
Admission: EM | Admit: 2024-04-24 | Discharge: 2024-04-24 | Disposition: A | Discharge status: Home / Self Care | Attending: FAMILY PRACTICE | Admitting: FAMILY PRACTICE

## 2024-04-24 ENCOUNTER — Other Ambulatory Visit: Payer: Self-pay

## 2024-04-24 DIAGNOSIS — R112 Nausea with vomiting, unspecified: Secondary | ICD-10-CM | POA: Insufficient documentation

## 2024-04-24 DIAGNOSIS — R197 Diarrhea, unspecified: Secondary | ICD-10-CM | POA: Insufficient documentation

## 2024-04-24 DIAGNOSIS — R1084 Generalized abdominal pain: Secondary | ICD-10-CM

## 2024-04-24 DIAGNOSIS — R748 Abnormal levels of other serum enzymes: Secondary | ICD-10-CM

## 2024-04-24 DIAGNOSIS — N838 Other noninflammatory disorders of ovary, fallopian tube and broad ligament: Secondary | ICD-10-CM | POA: Insufficient documentation

## 2024-04-24 LAB — CBC WITH DIFF
BASOPHIL #: 0.12 10*3/uL — ABNORMAL HIGH (ref 0.00–0.10)
BASOPHIL %: 1 % (ref 0–1)
EOSINOPHIL #: 0.48 10*3/uL (ref 0.00–0.50)
EOSINOPHIL %: 2 % (ref 1–7)
HCT: 44.9 % — ABNORMAL HIGH (ref 31.2–41.9)
HGB: 15.1 g/dL — ABNORMAL HIGH (ref 10.9–14.3)
LYMPHOCYTE #: 2.66 10*3/uL (ref 1.10–3.10)
LYMPHOCYTE %: 14 % — ABNORMAL LOW (ref 16–46)
MCH: 32.6 pg (ref 24.7–32.8)
MCHC: 33.6 g/dL (ref 32.3–35.6)
MCV: 98.9 fL — ABNORMAL HIGH (ref 75.5–95.3)
MONOCYTE #: 1.3 10*3/uL — ABNORMAL HIGH (ref 0.20–0.90)
MONOCYTE %: 7 % (ref 4–11)
MPV: 10.4 fL (ref 7.9–10.8)
NEUTROPHIL #: 14.86 10*3/uL — ABNORMAL HIGH (ref 1.90–8.20)
NEUTROPHIL %: 77 % (ref 43–77)
PLATELETS: 313 10*3/uL (ref 140–440)
RBC: 4.63 10*6/uL (ref 3.63–4.92)
RDW: 12.8 % (ref 12.3–17.7)
WBC: 19.4 10*3/uL — ABNORMAL HIGH (ref 3.8–11.8)

## 2024-04-24 LAB — COMPREHENSIVE METABOLIC PANEL, NON-FASTING
ALBUMIN/GLOBULIN RATIO: 1 (ref 0.8–1.4)
ALBUMIN: 3.8 g/dL (ref 3.4–5.0)
ALKALINE PHOSPHATASE: 72 U/L (ref 46–116)
ALT (SGPT): 38 U/L (ref <=78)
ANION GAP: 14 mmol/L — ABNORMAL HIGH (ref 4–13)
AST (SGOT): 29 U/L (ref 15–37)
BILIRUBIN TOTAL: 0.3 mg/dL (ref 0.2–1.0)
BUN/CREA RATIO: 17
BUN: 13 mg/dL (ref 7–18)
CALCIUM, CORRECTED: 9.4 mg/dL
CALCIUM: 9.2 mg/dL (ref 8.5–10.1)
CHLORIDE: 104 mmol/L (ref 98–107)
CO2 TOTAL: 23 mmol/L (ref 21–32)
CREATININE: 0.76 mg/dL (ref 0.55–1.02)
ESTIMATED GFR: 98 mL/min/{1.73_m2} (ref >59)
GLOBULIN: 3.7
GLUCOSE: 114 mg/dL — ABNORMAL HIGH (ref 74–106)
OSMOLALITY, CALCULATED: 282 mosm/kg (ref 270–290)
POTASSIUM: 3.4 mmol/L — ABNORMAL LOW (ref 3.5–5.1)
PROTEIN TOTAL: 7.5 g/dL (ref 6.4–8.2)
SODIUM: 141 mmol/L (ref 136–145)

## 2024-04-24 LAB — URINALYSIS, MACRO/MICRO
BILIRUBIN: NEGATIVE mg/dL
BLOOD: NEGATIVE mg/dL
GLUCOSE: NEGATIVE mg/dL
KETONES: 15 mg/dL — AB
LEUKOCYTES: NEGATIVE WBCs/uL
NITRITE: NEGATIVE
PH: 6.5 (ref 4.6–8.0)
PROTEIN: NEGATIVE mg/dL
SPECIFIC GRAVITY: 1.01 (ref 1.003–1.035)
UROBILINOGEN: 0.2 mg/dL (ref 0.2–1.0)

## 2024-04-24 LAB — DRUG SCREEN, NO CONFIRMATION, URINE
AMPHETAMINES URINE: NEGATIVE
BARBITURATES URINE: NEGATIVE
BENZODIAZEPINES URINE: NEGATIVE
CANNABINOIDS URINE: POSITIVE — AB
COCAINE METABOLITES URINE: POSITIVE — AB
METHADONE URINE: NEGATIVE
OPIATES URINE: NEGATIVE
PCP URINE: NEGATIVE

## 2024-04-24 LAB — LIPASE: LIPASE: 159 U/L — ABNORMAL HIGH (ref 16–77)

## 2024-04-24 MED ORDER — FENTANYL (PF) 50 MCG/ML INJECTION SOLUTION
INTRAMUSCULAR | Status: AC
Start: 2024-04-24 — End: 2024-04-24
  Filled 2024-04-24: qty 2

## 2024-04-24 MED ORDER — HALOPERIDOL LACTATE 5 MG/ML INJECTION SOLUTION
INTRAMUSCULAR | Status: AC
Start: 2024-04-24 — End: 2024-04-24
  Filled 2024-04-24: qty 1

## 2024-04-24 MED ORDER — PROCHLORPERAZINE EDISYLATE 10 MG/2 ML (5 MG/ML) INJECTION SOLUTION
10.0000 mg | INTRAMUSCULAR | Status: AC
Start: 2024-04-24 — End: 2024-04-24
  Administered 2024-04-24: 10 mg via INTRAVENOUS

## 2024-04-24 MED ORDER — HALOPERIDOL LACTATE 5 MG/ML INJECTION SOLUTION
1.0000 mg | INTRAMUSCULAR | Status: AC
Start: 2024-04-24 — End: 2024-04-24
  Administered 2024-04-24: 1 mg via INTRAVENOUS

## 2024-04-24 MED ORDER — SODIUM CHLORIDE 0.9 % IV BOLUS
30.0000 mL/kg | INJECTION | Status: AC
Start: 2024-04-24 — End: 2024-04-24
  Administered 2024-04-24: 0 mL via INTRAVENOUS
  Administered 2024-04-24: 2409 mL via INTRAVENOUS

## 2024-04-24 MED ORDER — LORAZEPAM 2 MG/ML INJECTION SOLUTION
INTRAMUSCULAR | Status: AC
Start: 2024-04-24 — End: 2024-04-24
  Filled 2024-04-24: qty 1

## 2024-04-24 MED ORDER — LACTATED RINGERS IV BOLUS
1000.0000 mL | INJECTION | Status: AC
Start: 2024-04-24 — End: 2024-04-24
  Administered 2024-04-24: 1000 mL via INTRAVENOUS
  Administered 2024-04-24: 0 mL via INTRAVENOUS

## 2024-04-24 MED ORDER — PROCHLORPERAZINE EDISYLATE 10 MG/2 ML (5 MG/ML) INJECTION SOLUTION
INTRAMUSCULAR | Status: AC
Start: 2024-04-24 — End: 2024-04-24
  Filled 2024-04-24: qty 2

## 2024-04-24 MED ORDER — LORAZEPAM 2 MG/ML INJECTION WRAPPER
1.0000 mg | INTRAMUSCULAR | Status: AC
Start: 2024-04-24 — End: 2024-04-24
  Administered 2024-04-24: 1 mg via INTRAVENOUS

## 2024-04-24 MED ORDER — IOHEXOL 350 MG IODINE/ML INTRAVENOUS SOLUTION
100.0000 mL | INTRAVENOUS | Status: AC
Start: 2024-04-24 — End: 2024-04-24
  Administered 2024-04-24: 75 mL via INTRAVENOUS

## 2024-04-24 MED ORDER — FENTANYL (PF) 50 MCG/ML INJECTION SOLUTION
50.0000 ug | INTRAMUSCULAR | Status: AC
Start: 2024-04-24 — End: 2024-04-24
  Administered 2024-04-24: 50 ug via INTRAVENOUS

## 2024-04-24 MED ORDER — PROCHLORPERAZINE 25 MG RECTAL SUPPOSITORY
25.0000 mg | Freq: Two times a day (BID) | RECTAL | 0 refills | Status: AC | PRN
Start: 2024-04-24 — End: 2024-05-01

## 2024-04-24 MED ORDER — ONDANSETRON 4 MG DISINTEGRATING TABLET
4.0000 mg | ORAL_TABLET | Freq: Three times a day (TID) | ORAL | 0 refills | Status: DC | PRN
Start: 2024-04-24 — End: 2024-04-24

## 2024-04-24 MED ORDER — PROCHLORPERAZINE EDISYLATE 10 MG/2 ML (5 MG/ML) INJECTION SOLUTION
5.0000 mg | INTRAMUSCULAR | Status: AC
Start: 2024-04-24 — End: 2024-04-24
  Administered 2024-04-24: 5 mg via INTRAVENOUS

## 2024-04-24 NOTE — ED Nurses Note (Signed)
 Patient discharged home all instructions gone over with patient including medications with opportunity to ask questions. Patient verbalized understanding and ambulated off unit independently.

## 2024-04-24 NOTE — ED Nurses Note (Signed)
 Patient has vomited again Dr Sharalyn Dasen informed.

## 2024-04-24 NOTE — ED Nurses Note (Signed)
Patient vomited again at this time.

## 2024-04-24 NOTE — ED Provider Notes (Signed)
 CHIEF COMPLAINT  Chief Complaint   Patient presents with    Vomiting     Started vomiting about one hour ago and can't stop dry heaving.      HISTORY OF PRESENT ILLNESS  Melissa Hale, date of birth 1976/01/03, is a 48 y.o. female who presented to the Emergency Department complain of sudden onset of vomiting and prazosin 1 hour prior to arrival.  She says yesterday she felt well with no difficulties.  No fever chills no flank pain no chest pain no dyspnea.  Diffuse cramping abdominal pain.    PAST MEDICAL/SURGICAL/FAMILY/SOCIAL HISTORY  Past Medical History:   Diagnosis Date    Esophageal reflux     Viral hepatitis C     Wears glasses        Past Surgical History:   Procedure Laterality Date    GASTROSCOPY      HX HIP REPLACEMENT Left     HX TONSILLECTOMY      HX TUBAL LIGATION      HX WISDOM TEETH EXTRACTION      MEDIAL COLLATERAL LIGAMENT AND LATERAL COLLATERAL LIGAMENT REPAIR, KNEE      SPLENECTOMY, TOTAL         Family Medical History:       Problem Relation (Age of Onset)    Breast Cancer Maternal Grandmother, Maternal Aunt, Maternal Aunt          Social History     Socioeconomic History    Marital status: Single   Tobacco Use    Smoking status: Former     Types: Cigarettes    Smokeless tobacco: Never   Vaping Use    Vaping status: Every Day    Substances: Nicotine, Flavoring   Substance and Sexual Activity    Alcohol use: Not Currently    Drug use: Yes     Types: Marijuana   Other Topics Concern    Ability to Walk 2 Flight of Steps without SOB/CP Yes    Total Care No    Ability To Do Own ADL's Yes    Uses Cane No      ALLERGIES  Allergies   Allergen Reactions    Doxycycline Rash       PHYSICAL EXAM  VITAL SIGNS:  Filed Vitals:    04/24/24 0452 04/24/24 0528   BP: (!) 151/88    Pulse: 60    Resp: 20    Temp: 36.4 C (97.5 F)    SpO2: 99% 100%     GENERAL: PATIENT IS ALERT AND ORIENTED TO PERSON, PLACE, AND TIME.  Moderate distress HEAD: NORMOCEPHALIC AND ATRAUMATIC.  EYES: PUPILS EQUALLY ROUND AND  REACT TO LIGHT. EXTRAOCULAR MOVEMENTS INTACT.  EARS: GROSS HEARING INTACT. EXTERNAL EARS WITHIN NORMAL LIMITS.  THROAT: MOIST ORAL MUCOSA. NO ERYTHEMA OR EXUDATE OF THE PHARYNX.  NECK: SUPPLE. TRACHEA MIDLINE.  NO LYMPHADENOPATHY  CARDIOVASCULAR: REGULAR, RATE, AND RHYTHM. NO MURMUR.  LUNGS: CLEAR TO AUSCULTATION BILATERAL.  ABDOMEN: SOFT, diffusely tender no rebound or guarding, NON-DISTENDED, AND BOWEL SOUNDS ARE PRESENT.  GENITOURINARY: DEFERRED.  RECTAL: DEFERRED.  EXTREMITIES: NO CYANOSIS, CLUBBING, OR EDEMA.  NO GROSS DEFORMITIES, MOVES ALL 4 EXTREMITIES  SKIN: WARM AND DRY.  NEUROLOGIC: CRANIAL NERVES II THROUGH XII ARE GROSSLY INTACT ALTHOUGH NOT INDIVIDUALLY TESTED.  No gross motor deficits  PSYCHIATRIC:  Anxious    PROCEDURES    DIAGNOSTICS  Labs:  Labs listed below were reviewed and interpreted by me.  Results for orders placed or performed during the hospital  encounter of 04/24/24   COMPREHENSIVE METABOLIC PANEL, NON-FASTING   Result Value Ref Range    SODIUM 141 136 - 145 mmol/L    POTASSIUM 3.4 (L) 3.5 - 5.1 mmol/L    CHLORIDE 104 98 - 107 mmol/L    CO2 TOTAL 23 21 - 32 mmol/L    ANION GAP 14 (H) 4 - 13 mmol/L    BUN 13 7 - 18 mg/dL    CREATININE 1.61 0.96 - 1.02 mg/dL    BUN/CREA RATIO 17     ESTIMATED GFR 98 >59 mL/min/1.24m^2    ALBUMIN 3.8 3.4 - 5.0 g/dL    CALCIUM 9.2 8.5 - 04.5 mg/dL    GLUCOSE 409 (H) 74 - 106 mg/dL    ALKALINE PHOSPHATASE 72 46 - 116 U/L    ALT (SGPT) 38 <=78 U/L    AST (SGOT) 29 15 - 37 U/L    BILIRUBIN TOTAL 0.3 0.2 - 1.0 mg/dL    PROTEIN TOTAL 7.5 6.4 - 8.2 g/dL    ALBUMIN/GLOBULIN RATIO 1.0 0.8 - 1.4    OSMOLALITY, CALCULATED 282 270 - 290 mOsm/kg    CALCIUM, CORRECTED 9.4 mg/dL    GLOBULIN 3.7    LIPASE   Result Value Ref Range    LIPASE 159 (H) 16 - 77 U/L   CBC WITH DIFF   Result Value Ref Range    WBC 19.4 (H) 3.8 - 11.8 x10^3/uL    RBC 4.63 3.63 - 4.92 x10^6/uL    HGB 15.1 (H) 10.9 - 14.3 g/dL    HCT 81.1 (H) 91.4 - 41.9 %    MCV 98.9 (H) 75.5 - 95.3 fL    MCH 32.6  24.7 - 32.8 pg    MCHC 33.6 32.3 - 35.6 g/dL    RDW 78.2 95.6 - 21.3 %    PLATELETS 313 140 - 440 x10^3/uL    MPV 10.4 7.9 - 10.8 fL    NEUTROPHIL % 77 43 - 77 %    LYMPHOCYTE % 14 (L) 16 - 46 %    MONOCYTE % 7 4 - 11 %    EOSINOPHIL % 2 1 - 7 %    BASOPHIL % 1 0 - 1 %    NEUTROPHIL # 14.86 (H) 1.90 - 8.20 x10^3/uL    LYMPHOCYTE # 2.66 1.10 - 3.10 x10^3/uL    MONOCYTE # 1.30 (H) 0.20 - 0.90 x10^3/uL    EOSINOPHIL # 0.48 0.00 - 0.50 x10^3/uL    BASOPHIL # 0.12 (H) 0.00 - 0.10 x10^3/uL   URINE DRUG SCREEN   Result Value Ref Range    AMPHETAMINES URINE Negative Negative    BARBITURATES URINE Negative Negative    BENZODIAZEPINES URINE Negative Negative    METHADONE URINE Negative Negative    COCAINE METABOLITES URINE Positive (A) Negative    OPIATES URINE Negative Negative    PCP URINE Negative Negative    CANNABINOIDS URINE Positive (A) Negative   URINALYSIS, MACRO/MICRO   Result Value Ref Range    COLOR Light Yellow Light Yellow, Yellow    APPEARANCE Clear Clear    SPECIFIC GRAVITY 1.010 1.003 - 1.035    PH 6.5 4.6 - 8.0    LEUKOCYTES Negative Negative WBCs/uL    NITRITE Negative Negative    PROTEIN Negative Negative mg/dL    GLUCOSE Negative Negative mg/dL    KETONES 15 (A) Negative mg/dL    BILIRUBIN Negative Negative mg/dL    BLOOD Negative Negative mg/dL    UROBILINOGEN 0.2  0.2 - 1.0 mg/dL     Radiology:  Results for orders placed or performed during the hospital encounter of 04/24/24   CT ABDOMEN PELVIS W IV CONTRAST     Status: None    Narrative    Kyung VAN KEUREN    RADIOLOGIST: Katelyn A Ziggas    CT ABDOMEN PELVIS W IV CONTRAST performed on 04/24/2024 6:17 AM    CLINICAL HISTORY: Abdominal pain, nausea or vomiting  sudden onset generalized abd pain with N/V, elevated lipase, hx of splenectomy/tubal lig.    TECHNIQUE:  Abdomen and pelvis CT with intravenous contrast.  IV CONTRAST: ml's of Omnipaque  350    COMPARISON:  01/28/2024. 10/28/2022.    FINDINGS:  Lung bases: Mild atelectasis.    Liver: Multiple small  hyperattenuating pericapsular nodules are similar in attenuation to multiple nodules in the left upper quadrant favoring splenosis, not significantly changed dating back to 2023.    Gallbladder:   Unremarkable.    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    Pancreas:   Unremarkable.    Adrenals:   Unremarkable.    Kidneys:   No hydronephrosis. Tiny low-density left renal lesion is too small to characterize but statistically favors a cyst.    Bladder:  Unremarkable.    Uterus and Adnexa:  Bilateral tubal ligation clips. Stable 2.8 cm left adnexal cyst located more anteriorly. New 3.2 cm left adnexal cyst located more posteriorly (see series 4, image 145). Multiple small right ovarian cysts.     Bowel:   No bowel obstruction.    Appendix:  Unremarkable.    Lymph nodes:  No suspicious lymph node enlargement.    Vasculature:   Normal caliber aorta. Mild atherosclerosis.     Peritoneum / Retroperitoneum: No ascites.  No free air.    Bones:   Degenerative changes are present in the spine. Left hip arthroplasty. 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        Impression    No evidence of an acute inflammatory or obstructive process in the abdomen or pelvis.    New 3.2 cm left adnexal cyst. Recommend follow-up pelvic ultrasound.    Chronic and incidental findings as above.          Radiologist location ID: [id number]         ED COURSE/MEDICAL DECISION MAKING  Medications Ordered/Administered in the ED   NS bolus infusion 30 mL/kg = 2,409 mL (2,409 mL Intravenous New Bag/New Syringe 04/24/24 0528)   fentaNYL (SUBLIMAZE) 50 mcg/mL injection (50 mcg Intravenous Given 04/24/24 0528)   LORazepam (ATIVAN) 2 mg/mL injection (1 mg Intravenous Given 04/24/24 0528)   prochlorperazine (COMPAZINE) 5 mg/mL injection (10 mg Intravenous Given 04/24/24 0529)   iohexol  (OMNIPAQUE  350) infusion (75 mL Intravenous Given 04/24/24 0618)   haloperidol (HALDOL) 5 mg/mL injection (1 mg Intravenous Given 04/24/24  8119)          Medical Decision Making  Differential includes small bowel obstruction food poisoning other viral infection.  Possible THC use.  She does state that has she use marijuana in the last 3 or 4 days.    CT scan looks pretty good.  There was no ovarian cysts but sat certainly it is not causing her vomiting this morning.  She is feeling better.  We will DC home with antiemetics and a clear liquid diet    Amount and/or Complexity of Data Reviewed  Labs: ordered.  Radiology: ordered.    Risk  Prescription  drug management.  Parenteral controlled substances.      CRITICAL CARE    CLINICAL IMPRESSION  Clinical Impression   Vomiting (Primary)     DISPOSITION  Discharged       DISCHARGE MEDICATIONS  Current Discharge Medication List        START taking these medications    Details   !! ondansetron  (ZOFRAN  ODT) 4 mg Oral Tablet, Rapid Dissolve Take 1 Tablet (4 mg total) by mouth Every 8 hours as needed for Nausea/Vomiting  Qty: 12 Tablet, Refills: 0       !! - Potential duplicate medications found. Please discuss with provider.          Georgette Kins Johnell Na M.D.   04/24/2024, 06:34   [facility]  Department of Emergency Medicine  [place]  [place]    This note was partially generated using MModal Fluency Direct system, and there may be some incorrect words, spellings, and punctuation that were not noted in checking the note before saving.    -----

## 2024-04-24 NOTE — ED Attending Handoff Note (Signed)
 MDM:  This 48 year old female patient presented overnight with nausea and vomiting that was resistant to Zofran , and the 1st dose of Compazine and then 1 mg of IM Haldol.  Nausea vomiting did subside with a 2nd dose of Compazine, 5 mg, but I gave him here and tolerated liquids and crackers.  She is feeling better, however not 100%.  Her lungs were clear, heart regular rate rhythm without murmur, abdomen is soft normal bowel sounds, mild generalized tenderness without guarding, rebound or referred pain.  Extremities warm moving symmetrically.    Labs reviewed and noted, reassessed after 2nd dose of Compazine    Impression:  Nausea vomiting and diarrhea, plan is to discharge to home with liquid IV or Pedialyte or Gatoryte for oral hydration and bland diet avoiding greasy, spicy, fatty fried or spicy foods, and Compazine suppositories.  See discharge      Discharged  Clinical Impression   Vomiting (Primary)     Medications Ordered/Administered in the ED   NS bolus infusion 30 mL/kg = 2,409 mL (0 mL Intravenous Stopped 04/24/24 0628)   fentaNYL (SUBLIMAZE) 50 mcg/mL injection (50 mcg Intravenous Given 04/24/24 0528)   LORazepam (ATIVAN) 2 mg/mL injection (1 mg Intravenous Given 04/24/24 0528)   prochlorperazine (COMPAZINE) 5 mg/mL injection (10 mg Intravenous Given 04/24/24 0529)   iohexol  (OMNIPAQUE  350) infusion (75 mL Intravenous Given 04/24/24 0618)   haloperidol (HALDOL) 5 mg/mL injection (1 mg Intravenous Given 04/24/24 1610)   prochlorperazine (COMPAZINE) 5 mg/mL injection (5 mg Intravenous Given 04/24/24 0945)   LR bolus infusion 1,000 mL (0 mL Intravenous Stopped 04/24/24 1015)        Current Discharge Medication List        CONTINUE these medications which have CHANGED during your visit.        Details   * ondansetron  4 mg Tablet, Rapid Dissolve  Commonly known as: ZOFRAN  ODT  What changed: Another medication with the same name was added. Make sure you understand how and when to take each.   4 mg, Oral, EVERY 8 HOURS  PRN  Qty: 12 Tablet  Refills: 0     * ondansetron  4 mg Tablet, Rapid Dissolve  Commonly known as: ZOFRAN  ODT  What changed: You were already taking a medication with the same name, and this prescription was added. Make sure you understand how and when to take each.   4 mg, Oral, EVERY 8 HOURS PRN  Qty: 12 Tablet  Refills: 0           * This list has 2 medication(s) that are the same as other medications prescribed for you. Read the directions carefully, and ask your doctor or other care provider to review them with you.                CONTINUE these medications - NO CHANGES were made during your visit.        Details   albuterol  sulfate 90 mcg/actuation oral inhaler  Commonly known as: PROVENTIL  or VENTOLIN  or PROAIR    1 Puff, Inhalation, EVERY 4 HOURS PRN  Refills: 0     cetirizine 10 mg Tablet  Commonly known as: zyrTEC   10 mg, Oral, Daily  Refills: 0     ketorolac  tromethamine  10 mg Tablet  Commonly known as: TORADOL    10 mg, Oral, EVERY 6 HOURS PRN  Qty: 12 Tablet  Refills: 0     meloxicam  15 mg Tablet  Commonly known as: MOBIC    15 mg, Oral,  Daily  Qty: 14 Tablet  Refills: 0     montelukast 10 mg Tablet  Commonly known as: SINGULAIR   10 mg, Oral, EVERY EVENING  Refills: 0     pantoprazole 40 mg Tablet, Delayed Release (E.C.)  Commonly known as: PROTONIX   40 mg, Oral, Daily  Refills: 0

## 2024-04-24 NOTE — ED Nurses Note (Signed)
 Pt nausea is resolved at this time.

## 2024-04-24 NOTE — ED Nurses Note (Signed)
 Dr Sharalyn Dasen informed patient still has some nausea but does feel better and able to go home.

## 2024-04-24 NOTE — ED Nurses Note (Signed)
 Patient continues to have some nausea and does not feel like she is ready to go home. Dr Sharalyn Dasen informed and will see patient.

## 2024-04-24 NOTE — Discharge Instructions (Addendum)
 Clear liquid diet only for the rest of the today.  Then gradually add back regular foods tomorrow.

## 2024-04-24 NOTE — ED Nurses Note (Signed)
 Dr Sharalyn Dasen in to see patient at this time and discuss discharge.

## 2024-05-12 ENCOUNTER — Ambulatory Visit (HOSPITAL_COMMUNITY)
Admission: RE | Admit: 2024-05-12 | Discharge: 2024-05-12 | Disposition: A | Discharge status: Still In Hospital | Payer: Self-pay | Source: Ambulatory Visit | Attending: Student in an Organized Health Care Education/Training Program | Admitting: Student in an Organized Health Care Education/Training Program

## 2024-05-12 ENCOUNTER — Other Ambulatory Visit (HOSPITAL_COMMUNITY): Payer: Self-pay

## 2024-05-12 ENCOUNTER — Other Ambulatory Visit: Payer: Self-pay

## 2024-05-12 DIAGNOSIS — Z96641 Presence of right artificial hip joint: Secondary | ICD-10-CM
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
## 2024-05-16 ENCOUNTER — Ambulatory Visit
Admission: RE | Admit: 2024-05-16 | Discharge: 2024-05-16 | Disposition: A | Discharge status: Still In Hospital | Payer: Self-pay | Source: Ambulatory Visit | Attending: Student in an Organized Health Care Education/Training Program | Admitting: Student in an Organized Health Care Education/Training Program

## 2024-05-16 ENCOUNTER — Other Ambulatory Visit: Payer: Self-pay

## 2024-05-16 NOTE — PT Treatment (Signed)
 [facility] Medicine [facility]  Outpatient Physical Therapy  [address]  [place], [address]  [phone]  (Fax) [phone]    Physical Therapy Treatment Note    Date: 05/16/2024  Patient's Name: Melissa Hale  Date of Birth: 19-Mar-1976  Physical Therapy Visit    Visit #/POC: 2 out of 16  Authorization: no auth required   POC Signed?: no  POC Ends: 7/18  Order Ends: open  Next Progress Note Due: POC    Evaluating Physical Therapist: Sheryl Donna, PT, DPT  PT diagnosis/Reason for Referral: right THA  Next Scheduled Physician Appointment: 05/25/24  Allergies/Contraindications: n/a      Subjective: Patient states she is doing significantly better.  Patient states she no longer needs to take her pain medications consistently.  Able to walk at home without AD but still uses the AD if she starts to get tired.      Objective: therex for hip mobility and strengthening per protocol    Measured ROM on n/a (mm/dd/yyyy): n/a  EXERCISE/ACTIVITY NAME REPETITIONS RESISTANCE COMPLETED THIS DOS   NuStep 6 minutes 4 Y   Supine heel slide  Supine hip abduction 15  15 AROM  AROM Y  Y   Supine glute max isometric in bridge position  Supine hip abduction in neutral 15  15 Isometric  isometric Y  Y     Supine ER in neutral 15 AROM Y   Hip march each side 15 AROM Y   Standing calf raise 12 AROM Y   Sit to stand  12 AROM 24" inch height Y                 DISCONTINUED ACTIVITIES                              Assessment: Patient exhibits significant improvements in right hip mobility and strength.  Able to perform transfers and bed mobility with independent movement of the RLE without assist.  Able to demonstrate standing marching with finger tip assist for balance on bars.  She demonstrate good gait balance without using FWW in the clinic.      Short-Term Goals: 4 Weeks   -  Patient will demonstrate improved R hip AROM to at least 90 flexion, 25 ER, 10 IR in supine to aid in ability to ambulate up and down stairs.                 -  Patient will demonstrate independence with progressive HEP to maximize gains from physical therapy.                -  Patient will report max 6/10 pain to aid in completion of ADLs/work duties.                -  Patient will demonstrate improved strength of R LE WFL for supine SLR without extension lag to aid in functional transfers.                -  Patient will wean to least restrictive assistive device with minimal to no gait deviation to aid community ambulation.     Long-Term Goals: 8 Weeks                -  Patient will demonstrate improved R hip strength of at least 4/5 to aid in functional transfers.                -  Patient will demonstrate improved TUG time of maximum 13 seconds with least restrictive assistive device to demonstrate decreased falls risk.                -  Patient will demonstrate 5xSTS in <13 seconds no UE assist to aid in completion of ADLs.                -  Patient will demonstrate improved functional ability with Patient Specific Scale Score of at least 6.                -  Patient will demonstrate ability to ambulate up and down stairs with reciprocal pattern and 1 use of UEs to aid in community ambulation.                -  Patient will demonstrate improved SLS to at least 3 seconds to decrease risk of falling.     Plan: Cancelling patient's next appointment and will follow up next week per protocol guidelines.    Total Session Time 26 and Timed code minutes 26  THERAPEUTIC EXERCISE 26 minutes      Tierney Behl, PT  05/16/2024, 11:23

## 2024-05-18 ENCOUNTER — Ambulatory Visit (HOSPITAL_COMMUNITY): Payer: Self-pay

## 2024-05-22 ENCOUNTER — Ambulatory Visit (HOSPITAL_COMMUNITY): Payer: Self-pay

## 2024-05-24 ENCOUNTER — Other Ambulatory Visit: Payer: Self-pay

## 2024-05-24 ENCOUNTER — Ambulatory Visit
Admission: RE | Admit: 2024-05-24 | Discharge: 2024-05-24 | Disposition: A | Discharge status: Still In Hospital | Payer: Self-pay | Source: Ambulatory Visit

## 2024-05-24 DIAGNOSIS — Z96641 Presence of right artificial hip joint: Secondary | ICD-10-CM | POA: Insufficient documentation

## 2024-05-24 NOTE — PT Treatment (Signed)
 [facility] Medicine [facility]  Outpatient Physical Therapy  [address]  [place], [address]  [phone]  (Fax) [phone]    Physical Therapy Treatment Note    Date: 05/24/2024  Patient's Name: Melissa Hale  Date of Birth: 1976/09/05  Physical Therapy Visit    Visit #/POC: 3 out of 16  Authorization: no auth required   POC Signed?: no  POC Ends: 7/18  Order Ends: open  Next Progress Note Due: POC     Evaluating Physical Therapist: Sheryl Donna, PT, DPT  PT diagnosis/Reason for Referral: right THA  Next Scheduled Physician Appointment: 05/25/24  Allergies/Contraindications: n/a        Subjective: Patient reports 2/10 right hip pain today.  Relates the area behind the knee feels itchy and sore like a bruise, but there is no bruise.  Follows up with her doctor tomorrow.     Objective: therex for hip mobility and strengthening per protocol     Measured ROM on n/a (mm/dd/yyyy): n/a  EXERCISE/ACTIVITY NAME REPETITIONS RESISTANCE COMPLETED THIS DOS   NuStep 6 minutes 4 Y   LAQ  Seated hamstring curl 30  20 3#  Green Y  Y   Supine heel slide  Supine hip abduction 30  30 AROM  AROM Y  Y   Supine glute max isometric in bridge position  Supine hip abduction in neutral 15  15 Isometric  isometric Y  Y      SAQ 20 3# Y   Supine ER in neutral 15 AROM Y   Hip march each side 15 AROM Y   Standing calf raise 12 AROM Y   Sit to stand  12 AROM 23" inch height Y    // bar side stepping 8 trips in // bars AROM Y                DISCONTINUED ACTIVITIES                                                Assessment: Patient tolerated all exercises without a reported increase in pain.  She is demonstrates independent ambulation within the gym with mild gait deviations of the right hip.  Able to demonstrate 23" table height sit to stands without UE assist.     Short-Term Goals: 4 Weeks   -  Patient will demonstrate improved R hip AROM to at least 90 flexion, 25 ER, 10 IR in supine to aid in ability to ambulate up  and down stairs.                -  Patient will demonstrate independence with progressive HEP to maximize gains from physical therapy.                -  Patient will report max 6/10 pain to aid in completion of ADLs/work duties.                -  Patient will demonstrate improved strength of R LE WFL for supine SLR without extension lag to aid in functional transfers.                -  Patient will wean to least restrictive assistive device with minimal to no gait deviation to aid community ambulation.     Long-Term Goals: 8 Weeks                -  Patient will demonstrate improved R hip strength of at least 4/5 to aid in functional transfers.                -  Patient will demonstrate improved TUG time of maximum 13 seconds with least restrictive assistive device to demonstrate decreased falls risk.                -  Patient will demonstrate 5xSTS in <13 seconds no UE assist to aid in completion of ADLs.                -  Patient will demonstrate improved functional ability with Patient Specific Scale Score of at least 6.                -  Patient will demonstrate ability to ambulate up and down stairs with reciprocal pattern and 1 use of UEs to aid in community ambulation.                -  Patient will demonstrate improved SLS to at least 3 seconds to decrease risk of falling.      Plan: Continue per doctor follow up.    Total Session Time 34 and Timed code minutes 34  THERAPEUTIC EXERCISE 34 minutes      Karthik Whittinghill, PT  05/24/2024, 13:47

## 2024-05-26 ENCOUNTER — Ambulatory Visit (HOSPITAL_COMMUNITY): Payer: Self-pay

## 2024-05-30 ENCOUNTER — Ambulatory Visit (HOSPITAL_COMMUNITY): Payer: Self-pay

## 2024-06-01 ENCOUNTER — Ambulatory Visit (HOSPITAL_COMMUNITY): Payer: Self-pay

## 2024-09-12 ENCOUNTER — Encounter (HOSPITAL_COMMUNITY): Payer: Self-pay

## 2024-09-12 DIAGNOSIS — Z1231 Encounter for screening mammogram for malignant neoplasm of breast: Secondary | ICD-10-CM

## 2024-09-21 ENCOUNTER — Encounter (HOSPITAL_BASED_OUTPATIENT_CLINIC_OR_DEPARTMENT_OTHER): Payer: Self-pay | Admitting: FAMILY PRACTICE

## 2024-09-21 ENCOUNTER — Emergency Department (HOSPITAL_BASED_OUTPATIENT_CLINIC_OR_DEPARTMENT_OTHER)

## 2024-09-21 ENCOUNTER — Other Ambulatory Visit: Payer: Self-pay

## 2024-09-21 ENCOUNTER — Emergency Department
Admission: EM | Admit: 2024-09-21 | Discharge: 2024-09-21 | Disposition: A | Discharge status: Home / Self Care | Attending: FAMILY PRACTICE | Admitting: FAMILY PRACTICE

## 2024-09-21 DIAGNOSIS — R109 Unspecified abdominal pain: Secondary | ICD-10-CM | POA: Insufficient documentation

## 2024-09-21 DIAGNOSIS — Z9081 Acquired absence of spleen: Secondary | ICD-10-CM

## 2024-09-21 DIAGNOSIS — R509 Fever, unspecified: Secondary | ICD-10-CM

## 2024-09-21 DIAGNOSIS — K439 Ventral hernia without obstruction or gangrene: Secondary | ICD-10-CM

## 2024-09-21 DIAGNOSIS — D72828 Other elevated white blood cell count: Secondary | ICD-10-CM

## 2024-09-21 HISTORY — DX: Pure hypercholesterolemia, unspecified: E78.00

## 2024-09-21 LAB — EXTENDED RESPIRATORY VIRUS PANEL
ADENOVIRUS ARRAY: NOT DETECTED
BORDETELLA HOLMESII: NOT DETECTED
BORDETELLA PARAPERTUSSIS/BRONCHISEPTICA: NOT DETECTED
BORDETELLA PERTUSSIS ARRAY: NOT DETECTED
METAPNEUMOVIRUS ARRAY: NOT DETECTED
PARAINFLUENZA 1 ARRAY: NOT DETECTED
PARAINFLUENZA 2 ARRAY: NOT DETECTED
PARAINFLUENZA 3 ARRAY: NOT DETECTED
PARAINFLUENZA 4 ARRAY: NOT DETECTED
RHINOVIRUS ARRAY: NOT DETECTED

## 2024-09-21 LAB — CBC WITH DIFF
BASOPHIL #: 0.07 x10ˆ3/uL (ref 0.00–0.10)
BASOPHIL %: 0 % (ref 0–1)
EOSINOPHIL #: 0.35 x10ˆ3/uL (ref 0.00–0.50)
EOSINOPHIL %: 2 % (ref 1–7)
HCT: 44.7 % — ABNORMAL HIGH (ref 31.2–41.9)
HGB: 14.9 g/dL — ABNORMAL HIGH (ref 10.9–14.3)
LYMPHOCYTE #: 3.9 x10ˆ3/uL — ABNORMAL HIGH (ref 1.10–3.10)
LYMPHOCYTE %: 27 % (ref 16–46)
MCH: 30.4 pg (ref 24.7–32.8)
MCHC: 33.2 g/dL (ref 32.3–35.6)
MCV: 91.6 fL (ref 75.5–95.3)
MONOCYTE #: 0.98 x10ˆ3/uL — ABNORMAL HIGH (ref 0.20–0.90)
MONOCYTE %: 7 % (ref 4–11)
MPV: 9.8 fL (ref 7.9–10.8)
NEUTROPHIL #: 9.17 x10ˆ3/uL — ABNORMAL HIGH (ref 1.90–8.20)
NEUTROPHIL %: 63 % (ref 43–77)
PLATELETS: 470 x10ˆ3/uL — ABNORMAL HIGH (ref 140–440)
RBC: 4.88 x10ˆ6/uL (ref 3.63–4.92)
RDW: 17.6 % (ref 12.3–17.7)
WBC: 14.5 x10ˆ3/uL — ABNORMAL HIGH (ref 3.8–11.8)

## 2024-09-21 LAB — URINALYSIS, MACRO/MICRO
BILIRUBIN: NEGATIVE mg/dL
GLUCOSE: NEGATIVE mg/dL
KETONES: NEGATIVE mg/dL
LEUKOCYTES: NEGATIVE WBCs/uL
NITRITE: NEGATIVE
PH: 6.5 (ref 4.6–8.0)
PROTEIN: NEGATIVE mg/dL
SPECIFIC GRAVITY: 1.02 (ref 1.003–1.035)
UROBILINOGEN: 0.2 mg/dL (ref 0.2–1.0)

## 2024-09-21 LAB — HEPATIC FUNCTION PANEL
ALBUMIN/GLOBULIN RATIO: 1.1 (ref 0.8–1.4)
ALBUMIN: 4.2 g/dL (ref 3.4–5.0)
ALKALINE PHOSPHATASE: 69 U/L (ref 46–116)
ALT (SGPT): 32 U/L (ref <=78)
AST (SGOT): 22 U/L (ref 15–37)
BILIRUBIN DIRECT: 0.1 mg/dL (ref 0.0–0.2)
BILIRUBIN TOTAL: 0.3 mg/dL (ref 0.2–1.0)
BILIRUBIN, INDIRECT: 0.2 mg/dL
GLOBULIN: 3.7
PROTEIN TOTAL: 7.9 g/dL (ref 6.4–8.2)

## 2024-09-21 LAB — BASIC METABOLIC PANEL
ANION GAP: 10 mmol/L (ref 4–13)
BUN/CREA RATIO: 18
BUN: 16 mg/dL (ref 7–18)
CALCIUM: 9.7 mg/dL (ref 8.5–10.1)
CHLORIDE: 103 mmol/L (ref 98–107)
CO2 TOTAL: 25 mmol/L (ref 21–32)
CREATININE: 0.87 mg/dL (ref 0.55–1.02)
ESTIMATED GFR: 83 mL/min/1.73mˆ2 (ref >59)
GLUCOSE: 97 mg/dL (ref 74–106)
OSMOLALITY, CALCULATED: 277 mosm/kg (ref 270–290)
POTASSIUM: 3.9 mmol/L (ref 3.5–5.1)
SODIUM: 138 mmol/L (ref 136–145)

## 2024-09-21 LAB — URINALYSIS, MICROSCOPIC

## 2024-09-21 LAB — LIPASE: LIPASE: 57 U/L (ref 16–77)

## 2024-09-21 NOTE — ED Nurses Note (Signed)
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
## 2024-09-21 NOTE — Discharge Instructions (Signed)
 With your fever that you had at home, which was not seen here, I sent off a respiratory panel the does more than the 4 tests that we have look for here in [place], which should be completed in the next hour so.  Francis, the charge nurse of the evening, we will notify you with results if you do not see it online.  Your white blood cell count was up mildly at 14,000, mania your tests were essentially unremarkable and the CT of your chest, abdomen, and pelvis was all normal.    Continue home as prescribed, you can take Tylenol as directed if needed for fever.  Call follow up primary care provider, call in the morning for an appointment.

## 2024-09-26 LAB — ADULT ROUTINE BLOOD CULTURE, SET OF 2 BOTTLES (BACTERIA AND YEAST)
BLOOD CULTURE, ROUTINE: NO GROWTH
BLOOD CULTURE, ROUTINE: NO GROWTH

## 2024-10-02 ENCOUNTER — Ambulatory Visit (INDEPENDENT_AMBULATORY_CARE_PROVIDER_SITE_OTHER): Admitting: Surgery

## 2024-10-02 ENCOUNTER — Encounter (INDEPENDENT_AMBULATORY_CARE_PROVIDER_SITE_OTHER): Payer: Self-pay | Admitting: Surgery

## 2024-10-02 ENCOUNTER — Other Ambulatory Visit: Payer: Self-pay

## 2024-10-02 VITALS — BP 133/80 | HR 68 | Temp 98.1°F | Resp 18 | Ht 64.0 in | Wt 177.0 lb

## 2024-10-02 DIAGNOSIS — R1084 Generalized abdominal pain: Secondary | ICD-10-CM

## 2024-10-03 ENCOUNTER — Ambulatory Visit (INDEPENDENT_AMBULATORY_CARE_PROVIDER_SITE_OTHER): Admitting: Surgery

## 2024-10-09 ENCOUNTER — Telehealth (INDEPENDENT_AMBULATORY_CARE_PROVIDER_SITE_OTHER): Payer: Self-pay | Admitting: Surgery

## 2024-10-09 NOTE — Telephone Encounter (Signed)
 Patient called stating you were going to review her images and call her in regards to hernia surgery, she has not heard anything from you, please advise. Melissa Backer, LPN  89/79/7974 15:47

## 2024-10-10 NOTE — Telephone Encounter (Signed)
 Patient notified, verbalized understanding, she is going to think about it all and call us  back. Lucie Backer, LPN  89/78/7974 08:18

## 2024-10-10 NOTE — Telephone Encounter (Signed)
 Tried to reach patient no answer, left a message to return our call. Lucie Backer, LPN  89/78/7974 08:05

## 2024-10-16 ENCOUNTER — Encounter (INDEPENDENT_AMBULATORY_CARE_PROVIDER_SITE_OTHER): Payer: Self-pay | Admitting: Surgery

## 2024-10-16 NOTE — Progress Notes (Signed)
 GENERAL SURGERY, [facility] GENERAL SURGERY  [address]  [place] [address]    History and Physical    Name: Melissa Hale MRN:  [id number]   Date: 10/02/2024 DOB:  [birth year] (48 y.o.)              Reason for Visit: Abdominal Pain    History of Present Illness  Melissa Hale presents today for evaluation of abdominal pain. The patient feels that she has a hernia in the right lower abdomen. The patient had a CT scan of the chest, abdomen and pelvis wo iv contrast which did not show any acute findings.      Review of the result(s) of each unique test:  Patient underwent diagnostic testing ( as above ) prior to this dates visit.  I have personally reviewed the results and that serves as a component of the medical decision making for this encounter       Review of prior external note(s) from each unique source:  Patients referral to this office including a recent assessment by the referring provider.  This was reviewed by me for this unique office visit for the indication and intent of the referral as well as any pertinent medical or surgical history relevant to the patients independent evaluation by me today.      Patient History  Past Medical History:   Diagnosis Date    Esophageal reflux     High cholesterol     Vertigo     Viral hepatitis C     Wears glasses          Past Surgical History:   Procedure Laterality Date    GASTROSCOPY      HX HIP REPLACEMENT Left     HX TONSILLECTOMY      HX TUBAL LIGATION      HX WISDOM TEETH EXTRACTION      MEDIAL COLLATERAL LIGAMENT AND LATERAL COLLATERAL LIGAMENT REPAIR, KNEE      SPLENECTOMY, TOTAL           Current Outpatient Medications   Medication Sig    albuterol  sulfate (PROVENTIL  OR VENTOLIN  OR PROAIR ) 90 mcg/actuation Inhalation oral inhaler Take 1 Puff by inhalation Every 4 hours as needed for Other    meloxicam  (MOBIC ) 15 mg Oral Tablet Take 1 Tablet (15 mg total) by mouth Once a day    pantoprazole (PROTONIX) 40 mg Oral Tablet, Delayed  Release (E.C.) Take 1 Tablet (40 mg total) by mouth Daily     Allergies[1]  Family Medical History:       Problem Relation (Age of Onset)    Breast Cancer Maternal Grandmother, Maternal Aunt, Maternal Aunt            Social History[2]         Physical Examination:  Vitals:    10/02/24 1509   BP: 133/80   Pulse: 68   Resp: 18   Temp: 36.7 C (98.1 F)   SpO2: 99%   Weight: 80.3 kg (177 lb)   Height: 1.626 m (5' 4)   BMI: 30.38        General: appropriate for age. in no acute distress.    Vital signs are present above and have been reviewed by me     HEENT: Atraumatic, Normocephalic. PERRLA. EOMI. Nose clear. Throat clear    Lungs: Nonlabored breathing with symmetric expansion. Clear to auscultation bilaterally    Heart:Regular wth respect to rate and rythmn.  Abdomen:Soft. Slightly tender to palpation in the RLQ but I do not feel a hernia where the patient complains of one. Nondistended and otherwise benign    Extremities: Grossly normal. No major deformities     Neuro:  Grossly normal motor and sensory function    Psychiatric: Alert and oriented to person, place, and time. affect appropriate      Assessment and Plan  I will review her CT scan and then discuss with the patient my recommendations. This will most likely be a diagnostic laparoscopy.      Follow Up:  No follow-ups on file.      ICD-10-CM    1. Generalized abdominal pain  R10.84           Montrae Braithwaite B Sadhana Frater, MD ,MBA,FACS    I appreciate the opportunity to be involved in the care of your patients.  If you have any questions or concerns regarding this encounter, please do not hesitate to contact me at your convenience.      This note may have been partially generated using MModal Fluency Direct system, and there may be some incorrect words, spellings, and punctuation that were not noted in checking the note before saving, though effort was made to avoid such errors.               [1]   Allergies  Allergen Reactions    Doxycycline Rash   [2]   Social  History  Tobacco Use    Smoking status: Former     Types: Cigarettes    Smokeless tobacco: Never   Vaping Use    Vaping status: Every Day    Substances: Nicotine, Flavoring   Substance Use Topics    Alcohol use: Not Currently    Drug use: Yes     Types: Marijuana

## 2024-12-02 ENCOUNTER — Emergency Department (HOSPITAL_BASED_OUTPATIENT_CLINIC_OR_DEPARTMENT_OTHER)

## 2024-12-02 ENCOUNTER — Emergency Department
Admission: EM | Admit: 2024-12-02 | Discharge: 2024-12-02 | Disposition: A | Discharge status: Home / Self Care | Attending: FAMILY PRACTICE | Admitting: FAMILY PRACTICE

## 2024-12-02 ENCOUNTER — Encounter (HOSPITAL_BASED_OUTPATIENT_CLINIC_OR_DEPARTMENT_OTHER): Payer: Self-pay

## 2024-12-02 ENCOUNTER — Other Ambulatory Visit: Payer: Self-pay

## 2024-12-02 DIAGNOSIS — Q8901 Asplenia (congenital): Secondary | ICD-10-CM

## 2024-12-02 DIAGNOSIS — R5082 Postprocedural fever: Secondary | ICD-10-CM | POA: Insufficient documentation

## 2024-12-02 DIAGNOSIS — K439 Ventral hernia without obstruction or gangrene: Secondary | ICD-10-CM

## 2024-12-02 DIAGNOSIS — Z9081 Acquired absence of spleen: Secondary | ICD-10-CM | POA: Insufficient documentation

## 2024-12-02 DIAGNOSIS — S3011XA Contusion of abdominal wall, initial encounter: Secondary | ICD-10-CM

## 2024-12-02 DIAGNOSIS — A419 Sepsis, unspecified organism: Secondary | ICD-10-CM

## 2024-12-02 DIAGNOSIS — M96843 Postprocedural seroma of a musculoskeletal structure following other procedure: Secondary | ICD-10-CM | POA: Insufficient documentation

## 2024-12-02 DIAGNOSIS — D72828 Other elevated white blood cell count: Secondary | ICD-10-CM | POA: Insufficient documentation

## 2024-12-02 LAB — CBC WITH DIFF
BASOPHIL #: 0.05 x10ˆ3/uL (ref 0.00–0.10)
BASOPHIL %: 0 % (ref 0–1)
EOSINOPHIL #: 0.42 x10ˆ3/uL (ref 0.00–0.50)
EOSINOPHIL %: 3 % (ref 1–7)
HCT: 41.6 % (ref 31.2–41.9)
HGB: 13.6 g/dL (ref 10.9–14.3)
LYMPHOCYTE #: 2.46 x10ˆ3/uL (ref 1.10–3.10)
LYMPHOCYTE %: 19 % (ref 16–46)
MCH: 31.5 pg (ref 24.7–32.8)
MCHC: 32.6 g/dL (ref 32.3–35.6)
MCV: 96.7 fL — ABNORMAL HIGH (ref 75.5–95.3)
MONOCYTE #: 1.12 x10ˆ3/uL — ABNORMAL HIGH (ref 0.20–0.90)
MONOCYTE %: 9 % (ref 4–11)
MPV: 9.5 fL (ref 7.9–10.8)
NEUTROPHIL #: 9.15 x10ˆ3/uL — ABNORMAL HIGH (ref 1.90–8.20)
NEUTROPHIL %: 69 % (ref 43–77)
PLATELETS: 376 x10ˆ3/uL (ref 140–440)
RBC: 4.3 x10ˆ6/uL (ref 3.63–4.92)
RDW: 15.8 % (ref 12.3–17.7)
WBC: 13.2 x10ˆ3/uL — ABNORMAL HIGH (ref 3.8–11.8)

## 2024-12-02 LAB — HEPATIC FUNCTION PANEL
ALBUMIN/GLOBULIN RATIO: 0.9 (ref 0.8–1.4)
ALBUMIN: 3.4 g/dL (ref 3.4–5.0)
ALKALINE PHOSPHATASE: 68 U/L (ref 46–116)
ALT (SGPT): 26 U/L (ref <=78)
AST (SGOT): 18 U/L (ref 15–37)
BILIRUBIN DIRECT: 0.1 mg/dL (ref 0.0–0.2)
BILIRUBIN TOTAL: 0.3 mg/dL (ref 0.2–1.0)
BILIRUBIN, INDIRECT: 0.2 mg/dL
GLOBULIN: 3.7
PROTEIN TOTAL: 7.1 g/dL (ref 6.4–8.2)

## 2024-12-02 LAB — COVID-19, FLU A/B, RSV RAPID BY PCR
INFLUENZA VIRUS TYPE A: NOT DETECTED
INFLUENZA VIRUS TYPE B: NOT DETECTED
RESPIRATORY SYNCTIAL VIRUS (RSV): NOT DETECTED
SARS-CoV-2: NOT DETECTED

## 2024-12-02 LAB — BLOOD GAS W/ CO-OX, LYTES, LACTATE REFLEX
%FIO2 (ARTERIAL): 21 %
BASE EXCESS (ARTERIAL): 4.3 mmol/L — ABNORMAL HIGH (ref 0.0–3.0)
BICARBONATE (ARTERIAL): 28.2 mmol/L — ABNORMAL HIGH (ref 21.0–28.0)
CARBOXYHEMOGLOBIN: 2.7 % (ref <=3.0)
CHLORIDE: 105 mmol/L (ref 98–107)
GLUCOSE: 113 mg/dL (ref 65–125)
HEMATOCRITRT: 40 % (ref 37–50)
HEMOGLOBIN: 13.3 g/dL (ref 12.0–18.0)
IONIZED CALCIUM: 1.17 mmol/L (ref 1.15–1.33)
LACTATE: 0.8 mmol/L (ref <=1.9)
MET-HEMOGLOBIN: 0.8 % (ref <=1.5)
O2 SATURATION (ARTERIAL): 97.9 % (ref 94.0–98.0)
O2CT: 18 %
OXYHEMOGLOBIN: 95.7 % — ABNORMAL HIGH (ref 90.0–95.0)
PAO2/FIO2 RATIO: 433
PCO2 (ARTERIAL): 33 mmHg — ABNORMAL LOW (ref 35–45)
PH (ARTERIAL): 7.52 — ABNORMAL HIGH (ref 7.35–7.45)
PO2 (ARTERIAL): 91 mmHg (ref 83–108)
SODIUM: 135 mmol/L — ABNORMAL LOW (ref 136–145)
WHOLE BLOOD POTASSIUM: 3.5 mmol/L (ref 3.5–5.1)

## 2024-12-02 LAB — BASIC METABOLIC PANEL
ANION GAP: 13 mmol/L (ref 4–13)
BUN/CREA RATIO: 21
BUN: 14 mg/dL (ref 7–18)
CALCIUM: 9.3 mg/dL (ref 8.5–10.1)
CHLORIDE: 104 mmol/L (ref 98–107)
CO2 TOTAL: 26 mmol/L (ref 21–32)
CREATININE: 0.66 mg/dL (ref 0.55–1.02)
ESTIMATED GFR: 109 mL/min/1.73mˆ2 (ref >59)
GLUCOSE: 106 mg/dL (ref 74–106)
OSMOLALITY, CALCULATED: 286 mosm/kg (ref 270–290)
POTASSIUM: 3.6 mmol/L (ref 3.5–5.1)
SODIUM: 143 mmol/L (ref 136–145)

## 2024-12-02 LAB — ECG 12 LEAD
Atrial Rate: 83 {beats}/min
Calculated P Axis: 55 degrees
Calculated R Axis: 15 degrees
Calculated T Axis: 54 degrees
PR Interval: 154 ms
QRS Duration: 82 ms
QT Interval: 340 ms
QTC Calculation: 399 ms
Ventricular rate: 83 {beats}/min

## 2024-12-02 LAB — URINALYSIS, MACRO/MICRO
BILIRUBIN: NEGATIVE mg/dL
BLOOD: NEGATIVE mg/dL
GLUCOSE: NEGATIVE mg/dL
KETONES: NEGATIVE mg/dL
LEUKOCYTES: NEGATIVE WBCs/uL
NITRITE: NEGATIVE
PH: 7 (ref 4.6–8.0)
PROTEIN: NEGATIVE mg/dL
SPECIFIC GRAVITY: 1.01 (ref 1.003–1.035)
UROBILINOGEN: 0.2 mg/dL (ref 0.2–1.0)

## 2024-12-02 LAB — LACTIC ACID LEVEL W/ REFLEX FOR LEVEL >2.0: LACTIC ACID: 0.7 mmol/L (ref 0.4–2.0)

## 2024-12-02 LAB — PTT (PARTIAL THROMBOPLASTIN TIME): APTT: 29.5 s (ref 25.0–38.0)

## 2024-12-02 LAB — TROPONIN-I: TROPONIN I: 2 ng/L (ref <15)

## 2024-12-02 MED ORDER — IOHEXOL 350 MG IODINE/ML INTRAVENOUS SOLUTION: 50.0000 mL | INTRAVENOUS | Status: DC

## 2024-12-02 MED ORDER — LEVOFLOXACIN 500 MG TABLET: 500.0000 mg | ORAL_TABLET | ORAL | 0 refills | Status: AC

## 2024-12-02 MED ORDER — DEXTROSE 5% IN WATER (D5W) FLUSH BAG - 250 ML: INTRAVENOUS | Status: DC | PRN

## 2024-12-02 MED ORDER — SODIUM CHLORIDE 0.9% FLUSH BAG - 250 ML: INTRAVENOUS | Status: DC | PRN

## 2024-12-02 MED ORDER — SODIUM CHLORIDE 0.9 % INTRAVENOUS SOLUTION
2.0000 g | INTRAVENOUS | Status: AC
  Administered 2024-12-02: 2 g via INTRAVENOUS
  Administered 2024-12-02: 0 g via INTRAVENOUS

## 2024-12-02 MED ORDER — METRONIDAZOLE 500 MG TABLET: 500.0000 mg | ORAL_TABLET | Freq: Three times a day (TID) | ORAL | 0 refills | Status: AC

## 2024-12-02 MED ORDER — CEFEPIME 2 GRAM SOLUTION FOR INJECTION
INTRAMUSCULAR | Status: AC
  Filled 2024-12-02: qty 12.5

## 2024-12-02 MED ORDER — SODIUM CHLORIDE 0.9 % INTRAVENOUS SOLUTION
INTRAVENOUS | Status: AC
  Filled 2024-12-02: qty 100

## 2024-12-02 MED ORDER — SODIUM CHLORIDE 0.9 % (FLUSH) INJECTION SYRINGE: 3.0000 mL | INJECTION | INTRAMUSCULAR | Status: DC | PRN

## 2024-12-02 MED ORDER — LACTATED RINGERS IV BOLUS
30.0000 mL/kg | INJECTION | Status: AC
  Administered 2024-12-02: 2163 mL via INTRAVENOUS
  Administered 2024-12-02: 0 mL via INTRAVENOUS

## 2024-12-02 MED ORDER — IOHEXOL 350 MG IODINE/ML INTRAVENOUS SOLUTION
75.0000 mL | INTRAVENOUS | Status: AC
  Administered 2024-12-02: 75 mL via INTRAVENOUS

## 2024-12-02 MED ORDER — SODIUM CHLORIDE 0.9 % (FLUSH) INJECTION SYRINGE
3.0000 mL | INJECTION | Freq: Three times a day (TID) | INTRAMUSCULAR | Status: DC
  Administered 2024-12-02: 0 mL

## 2024-12-02 MED ORDER — SODIUM CHLORIDE 0.9 % INTRAVENOUS SOLUTION: 2.0000 g | Freq: Two times a day (BID) | INTRAVENOUS | Status: DC
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
## 2024-12-02 NOTE — Discharge Instructions (Signed)
 Which is laboratory of fluids from your recent surgery noted in your abdomen today were you have the extreme pain.  Blood cell count was up at 13,000, lactic acid was 0.7 today.  Your electrolytes, kidney, and liver function tests were all essentially normal.    Continue home medications as prescribed, also in addition I have given you a prescription for Flagyl  to take 3 times a day for 10 days, and Levaquin  to take daily for 7 days.  Continue those with your 0 other home medications.    Should you get a yeast infection while taking the Levaquin , and for 24 hours after the last dose, use vaginal suppositories your creams, do not use Diflucan for the control of your yeast infection.

## 2024-12-02 NOTE — ED Nurses Note (Signed)
 Patient provided with drinks at bedside per request. Denies any further needs or complaints at this time. Plan of care ongoing.

## 2024-12-02 NOTE — ED Nurses Note (Signed)
 Patient discharged home with family.  AVS reviewed with patient/care giver.  A written copy of the AVS and discharge instructions was given to the patient/care giver. Scripts escribed to preferred pharmacy. Questions sufficiently answered as needed.  Patient/care giver encouraged to follow up with PCP as indicated.  In the event of an emergency, patient/care giver instructed to call 911 or go to the nearest emergency room.

## 2024-12-05 LAB — PROCALCITONIN: PROCALCITONIN: <0.02 ng/mL (ref <0.50)

## 2024-12-07 LAB — ADULT ROUTINE BLOOD CULTURE, SET OF 2 BOTTLES (BACTERIA AND YEAST)
BLOOD CULTURE, ROUTINE: NO GROWTH
BLOOD CULTURE, ROUTINE: NO GROWTH
